# Patient Record
Sex: Female | Born: 1960 | Race: Black or African American | Hispanic: No | Marital: Single | Smoking: Current every day smoker
Health system: Southern US, Community
[De-identification: ages and names within clinical notes are randomized; demographics above are authoritative.]

## PROBLEM LIST (undated history)

## (undated) DIAGNOSIS — E611 Iron deficiency: Secondary | ICD-10-CM

## (undated) DIAGNOSIS — K219 Gastro-esophageal reflux disease without esophagitis: Secondary | ICD-10-CM

## (undated) HISTORY — PX: ABDOMINAL HYSTERECTOMY: SHX81

---

## 1998-01-05 ENCOUNTER — Ambulatory Visit (HOSPITAL_COMMUNITY): Admission: RE | Admit: 1998-01-05 | Discharge: 1998-01-05 | Payer: Self-pay | Admitting: Obstetrics

## 1998-01-07 ENCOUNTER — Ambulatory Visit (HOSPITAL_COMMUNITY): Admission: RE | Admit: 1998-01-07 | Discharge: 1998-01-07 | Payer: Self-pay | Admitting: Obstetrics

## 1998-01-14 ENCOUNTER — Ambulatory Visit (HOSPITAL_COMMUNITY): Admission: RE | Admit: 1998-01-14 | Discharge: 1998-01-14 | Payer: Self-pay | Admitting: Obstetrics

## 1998-01-21 ENCOUNTER — Ambulatory Visit (HOSPITAL_COMMUNITY): Admission: RE | Admit: 1998-01-21 | Discharge: 1998-01-21 | Payer: Self-pay | Admitting: Obstetrics

## 1998-01-23 ENCOUNTER — Ambulatory Visit (HOSPITAL_COMMUNITY): Admission: RE | Admit: 1998-01-23 | Discharge: 1998-01-23 | Payer: Self-pay | Admitting: Obstetrics

## 1998-02-10 ENCOUNTER — Inpatient Hospital Stay (HOSPITAL_COMMUNITY): Admission: RE | Admit: 1998-02-10 | Discharge: 1998-02-13 | Payer: Self-pay | Admitting: Obstetrics

## 1998-06-29 ENCOUNTER — Ambulatory Visit (HOSPITAL_COMMUNITY): Admission: RE | Admit: 1998-06-29 | Discharge: 1998-06-29 | Payer: Self-pay | Admitting: Obstetrics

## 1998-06-29 ENCOUNTER — Encounter: Payer: Self-pay | Admitting: Obstetrics

## 1999-03-20 ENCOUNTER — Emergency Department (HOSPITAL_COMMUNITY): Admission: EM | Admit: 1999-03-20 | Discharge: 1999-03-20 | Payer: Self-pay | Admitting: Emergency Medicine

## 1999-03-24 ENCOUNTER — Other Ambulatory Visit: Admission: RE | Admit: 1999-03-24 | Discharge: 1999-03-24 | Payer: Self-pay | Admitting: Obstetrics

## 1999-04-20 ENCOUNTER — Encounter: Payer: Self-pay | Admitting: Obstetrics

## 1999-04-20 ENCOUNTER — Ambulatory Visit (HOSPITAL_COMMUNITY): Admission: RE | Admit: 1999-04-20 | Discharge: 1999-04-20 | Payer: Self-pay | Admitting: Obstetrics

## 2001-04-03 ENCOUNTER — Ambulatory Visit (HOSPITAL_COMMUNITY): Admission: RE | Admit: 2001-04-03 | Discharge: 2001-04-03 | Payer: Self-pay | Admitting: Obstetrics

## 2001-04-03 ENCOUNTER — Encounter: Payer: Self-pay | Admitting: Obstetrics

## 2003-05-21 ENCOUNTER — Encounter: Payer: Self-pay | Admitting: Internal Medicine

## 2003-05-21 ENCOUNTER — Encounter: Admission: RE | Admit: 2003-05-21 | Discharge: 2003-05-21 | Payer: Self-pay | Admitting: Internal Medicine

## 2004-09-13 ENCOUNTER — Encounter: Admission: RE | Admit: 2004-09-13 | Discharge: 2004-09-13 | Payer: Self-pay | Admitting: Internal Medicine

## 2006-05-24 ENCOUNTER — Encounter: Admission: RE | Admit: 2006-05-24 | Discharge: 2006-05-24 | Payer: Self-pay | Admitting: Internal Medicine

## 2010-08-29 ENCOUNTER — Encounter: Payer: Self-pay | Admitting: Internal Medicine

## 2011-07-22 ENCOUNTER — Encounter (HOSPITAL_COMMUNITY)
Admission: RE | Admit: 2011-07-22 | Discharge: 2011-07-22 | Disposition: A | Discharge status: Home / Self Care | Payer: Managed Care, Other (non HMO) | Source: Ambulatory Visit | Attending: Obstetrics and Gynecology | Admitting: Obstetrics and Gynecology

## 2011-07-22 ENCOUNTER — Encounter (HOSPITAL_COMMUNITY): Payer: Self-pay

## 2011-07-22 HISTORY — DX: Gastro-esophageal reflux disease without esophagitis: K21.9

## 2011-07-22 LAB — CBC
Hemoglobin: 14 g/dL (ref 12.0–15.0)
RBC: 4.3 MIL/uL (ref 3.87–5.11)

## 2011-07-22 NOTE — Patient Instructions (Signed)
YOUR PROCEDURE IS SCHEDULED ON:07/25/11  ENTER THROUGH THE MAIN ENTRANCE OF [facility] AT: 6am  USE DESK PHONE AND DIAL [phone] TO INFORM us OF YOUR ARRIVAL  CALL [phone] IF YOU HAVE ANY QUESTIONS OR PROBLEMS PRIOR TO YOUR ARRIVAL.  REMEMBER: DO NOT EAT OR DRINK AFTER MIDNIGHT : Sunday  SPECIAL INSTRUCTIONS:   YOU MAY BRUSH YOUR TEETH THE MORNING OF SURGERY   TAKE THESE MEDICINES THE DAY OF SURGERY WITH SIP OF WATER: Pepcid   DO NOT WEAR JEWELRY, EYE MAKEUP, LIPSTICK OR DARK FINGERNAIL POLISH DO NOT WEAR LOTIONS  DO NOT SHAVE FOR 48 HOURS PRIOR TO SURGERY  YOU WILL NOT BE ALLOWED TO DRIVE YOURSELF HOME.  NAME OF DRIVER:Thelma

## 2011-07-25 ENCOUNTER — Ambulatory Visit (HOSPITAL_COMMUNITY): Payer: Managed Care, Other (non HMO) | Admitting: Registered Nurse

## 2011-07-25 ENCOUNTER — Encounter (HOSPITAL_COMMUNITY): Payer: Self-pay | Admitting: *Deleted

## 2011-07-25 ENCOUNTER — Encounter (HOSPITAL_COMMUNITY)
Admission: RE | Disposition: A | Discharge status: Home / Self Care | Payer: Self-pay | Source: Ambulatory Visit | Attending: Obstetrics and Gynecology

## 2011-07-25 ENCOUNTER — Encounter (HOSPITAL_COMMUNITY): Payer: Self-pay | Admitting: Registered Nurse

## 2011-07-25 ENCOUNTER — Other Ambulatory Visit (HOSPITAL_COMMUNITY): Payer: Self-pay | Admitting: Obstetrics and Gynecology

## 2011-07-25 ENCOUNTER — Inpatient Hospital Stay (HOSPITAL_COMMUNITY)
Admission: RE | Admit: 2011-07-25 | Discharge: 2011-07-26 | DRG: 748 | Disposition: A | Discharge status: Home / Self Care | Payer: Managed Care, Other (non HMO) | Source: Ambulatory Visit | Attending: Obstetrics and Gynecology | Admitting: Obstetrics and Gynecology

## 2011-07-25 DIAGNOSIS — N393 Stress incontinence (female) (male): Secondary | ICD-10-CM | POA: Diagnosis present

## 2011-07-25 DIAGNOSIS — N993 Prolapse of vaginal vault after hysterectomy: Principal | ICD-10-CM | POA: Diagnosis present

## 2011-07-25 DIAGNOSIS — IMO0002 Reserved for concepts with insufficient information to code with codable children: Secondary | ICD-10-CM

## 2011-07-25 HISTORY — PX: BURCH PROCEDURE: SHX1273

## 2011-07-25 HISTORY — PX: ANTERIOR AND POSTERIOR REPAIR: SHX5121

## 2011-07-25 LAB — SAMPLE TO BLOOD BANK

## 2011-07-25 SURGERY — ANTERIOR (CYSTOCELE) AND POSTERIOR REPAIR (RECTOCELE)
Anesthesia: General | Site: Vagina | Wound class: Clean Contaminated

## 2011-07-25 MED ORDER — FENTANYL CITRATE 0.05 MG/ML IJ SOLN
INTRAMUSCULAR | Status: DC | PRN
  Administered 2011-07-25 (×6): 50 ug via INTRAVENOUS
  Administered 2011-07-25: 100 ug via INTRAVENOUS
  Administered 2011-07-25: 50 ug via INTRAVENOUS

## 2011-07-25 MED ORDER — MENTHOL 3 MG MT LOZG
1.0000 | LOZENGE | OROMUCOSAL | Status: DC | PRN
  Filled 2011-07-25: qty 9

## 2011-07-25 MED ORDER — ONDANSETRON HCL 4 MG/2ML IJ SOLN
INTRAMUSCULAR | Status: DC | PRN
  Administered 2011-07-25: 4 mg via INTRAVENOUS

## 2011-07-25 MED ORDER — CEFAZOLIN SODIUM 1-5 GM-% IV SOLN
INTRAVENOUS | Status: AC
  Filled 2011-07-25: qty 50

## 2011-07-25 MED ORDER — LIDOCAINE HCL (CARDIAC) 20 MG/ML IV SOLN
INTRAVENOUS | Status: AC
  Filled 2011-07-25: qty 5

## 2011-07-25 MED ORDER — IBUPROFEN 600 MG PO TABS
600.0000 mg | ORAL_TABLET | Freq: Four times a day (QID) | ORAL | Status: DC | PRN
  Administered 2011-07-26: 600 mg via ORAL
  Filled 2011-07-25: qty 1

## 2011-07-25 MED ORDER — ONDANSETRON HCL 4 MG/2ML IJ SOLN
INTRAMUSCULAR | Status: AC
  Filled 2011-07-25: qty 2

## 2011-07-25 MED ORDER — KETOROLAC TROMETHAMINE 30 MG/ML IJ SOLN
INTRAMUSCULAR | Status: DC | PRN
  Administered 2011-07-25: 30 mg via INTRAVENOUS

## 2011-07-25 MED ORDER — MIDAZOLAM HCL 2 MG/2ML IJ SOLN
INTRAMUSCULAR | Status: AC
  Filled 2011-07-25: qty 2

## 2011-07-25 MED ORDER — MIDAZOLAM HCL 5 MG/5ML IJ SOLN
INTRAMUSCULAR | Status: DC | PRN
  Administered 2011-07-25: 2 mg via INTRAVENOUS

## 2011-07-25 MED ORDER — GLYCOPYRROLATE 0.2 MG/ML IJ SOLN
INTRAMUSCULAR | Status: DC | PRN
  Administered 2011-07-25: 0.2 mg via INTRAVENOUS

## 2011-07-25 MED ORDER — PROPOFOL 10 MG/ML IV EMUL
INTRAVENOUS | Status: DC | PRN
  Administered 2011-07-25: 200 mg via INTRAVENOUS

## 2011-07-25 MED ORDER — MORPHINE SULFATE 4 MG/ML IJ SOLN: 1.0000 mg | INTRAMUSCULAR | Status: DC | PRN

## 2011-07-25 MED ORDER — LIDOCAINE HCL (CARDIAC) 20 MG/ML IV SOLN
INTRAVENOUS | Status: DC | PRN
  Administered 2011-07-25: 100 mg via INTRAVENOUS

## 2011-07-25 MED ORDER — OXYCODONE-ACETAMINOPHEN 5-325 MG PO TABS
1.0000 | ORAL_TABLET | ORAL | Status: DC | PRN
Start: 2011-07-25 — End: 2011-07-26
  Administered 2011-07-25 – 2011-07-26 (×7): 1 via ORAL
  Filled 2011-07-25 (×7): qty 1

## 2011-07-25 MED ORDER — DEXTROSE IN LACTATED RINGERS 5 % IV SOLN
INTRAVENOUS | Status: DC
  Administered 2011-07-25 (×2): via INTRAVENOUS

## 2011-07-25 MED ORDER — MEPERIDINE HCL 25 MG/ML IJ SOLN: 6.2500 mg | INTRAMUSCULAR | Status: DC | PRN

## 2011-07-25 MED ORDER — BUPIVACAINE ON-Q PAIN PUMP (FOR ORDER SET NO CHG): INJECTION | Status: DC

## 2011-07-25 MED ORDER — KETOROLAC TROMETHAMINE 30 MG/ML IJ SOLN
INTRAMUSCULAR | Status: AC
  Filled 2011-07-25: qty 1

## 2011-07-25 MED ORDER — LACTATED RINGERS IV SOLN
INTRAVENOUS | Status: DC
  Administered 2011-07-25 (×3): via INTRAVENOUS

## 2011-07-25 MED ORDER — NEOSTIGMINE METHYLSULFATE 1 MG/ML IJ SOLN
INTRAMUSCULAR | Status: AC
  Filled 2011-07-25: qty 10

## 2011-07-25 MED ORDER — PROMETHAZINE HCL 25 MG/ML IJ SOLN: 6.2500 mg | INTRAMUSCULAR | Status: DC | PRN

## 2011-07-25 MED ORDER — FENTANYL CITRATE 0.05 MG/ML IJ SOLN
INTRAMUSCULAR | Status: AC
  Filled 2011-07-25: qty 5

## 2011-07-25 MED ORDER — HYDROMORPHONE HCL PF 1 MG/ML IJ SOLN: 0.2500 mg | INTRAMUSCULAR | Status: DC | PRN

## 2011-07-25 MED ORDER — KETOROLAC TROMETHAMINE 30 MG/ML IJ SOLN: 15.0000 mg | Freq: Once | INTRAMUSCULAR | Status: DC | PRN

## 2011-07-25 MED ORDER — PROPOFOL 10 MG/ML IV EMUL
INTRAVENOUS | Status: AC
  Filled 2011-07-25: qty 20

## 2011-07-25 MED ORDER — DEXTROSE IN LACTATED RINGERS 5 % IV SOLN: INTRAVENOUS | Status: DC

## 2011-07-25 MED ORDER — DEXAMETHASONE SODIUM PHOSPHATE 10 MG/ML IJ SOLN
INTRAMUSCULAR | Status: AC
  Filled 2011-07-25: qty 1

## 2011-07-25 MED ORDER — GLYCOPYRROLATE 0.2 MG/ML IJ SOLN
INTRAMUSCULAR | Status: AC
  Filled 2011-07-25: qty 1

## 2011-07-25 MED ORDER — NEOSTIGMINE METHYLSULFATE 1 MG/ML IJ SOLN
INTRAMUSCULAR | Status: DC | PRN
  Administered 2011-07-25: 1 mg via INTRAVENOUS

## 2011-07-25 MED ORDER — ROCURONIUM BROMIDE 100 MG/10ML IV SOLN
INTRAVENOUS | Status: DC | PRN
  Administered 2011-07-25: 40 mg via INTRAVENOUS

## 2011-07-25 MED ORDER — CEFAZOLIN SODIUM 1-5 GM-% IV SOLN
1.0000 g | INTRAVENOUS | Status: AC
  Administered 2011-07-25: 1 g via INTRAVENOUS

## 2011-07-25 MED ORDER — TEMAZEPAM 15 MG PO CAPS: 15.0000 mg | ORAL_CAPSULE | Freq: Every evening | ORAL | Status: DC | PRN

## 2011-07-25 MED ORDER — DEXAMETHASONE SODIUM PHOSPHATE 10 MG/ML IJ SOLN
INTRAMUSCULAR | Status: DC | PRN
  Administered 2011-07-25: 10 mg via INTRAVENOUS

## 2011-07-25 MED ORDER — ROCURONIUM BROMIDE 50 MG/5ML IV SOLN
INTRAVENOUS | Status: AC
  Filled 2011-07-25: qty 1

## 2011-07-25 SURGICAL SUPPLY — 44 items
BARRIER ADHS 3X4 INTERCEED (GAUZE/BANDAGES/DRESSINGS) IMPLANT
BRR ADH 4X3 ABS CNTRL BYND (GAUZE/BANDAGES/DRESSINGS)
CANISTER SUCTION 2500CC (MISCELLANEOUS) ×4 IMPLANT
CATH ROBINSON RED A/P 16FR (CATHETERS) IMPLANT
CLOTH BEACON ORANGE TIMEOUT ST (SAFETY) ×4 IMPLANT
DISSECTOR SPONGE CHERRY (GAUZE/BANDAGES/DRESSINGS) IMPLANT
DRAPE PROXIMA HALF (DRAPES) ×2 IMPLANT
DRAPE UNDERBUTTOCKS STRL (DRAPE) ×2 IMPLANT
DRSG COVADERM 4X10 (GAUZE/BANDAGES/DRESSINGS) ×2 IMPLANT
GAUZE PACKING IODOFORM 2 (PACKING) IMPLANT
GAUZE SPONGE 4X4 16PLY XRAY LF (GAUZE/BANDAGES/DRESSINGS) ×8 IMPLANT
GLOVE BIO SURGEON STRL SZ 6 (GLOVE) ×4 IMPLANT
GLOVE BIO SURGEON STRL SZ7 (GLOVE) ×8 IMPLANT
GLOVE BIOGEL PI IND STRL 7.0 (GLOVE) ×5 IMPLANT
GLOVE BIOGEL PI INDICATOR 7.0 (GLOVE) ×1
GOWN PREVENTION PLUS LG XLONG (DISPOSABLE) ×8 IMPLANT
GOWN PREVENTION PLUS XLARGE (GOWN DISPOSABLE) ×12 IMPLANT
LEGGING LITHOTOMY PAIR STRL (DRAPES) ×2 IMPLANT
NS IRRIG 1000ML POUR BTL (IV SOLUTION) ×4 IMPLANT
PACK ABDOMINAL GYN (CUSTOM PROCEDURE TRAY) ×2 IMPLANT
PACK VAGINAL WOMENS (CUSTOM PROCEDURE TRAY) ×4 IMPLANT
PAD OB MATERNITY 4.3X12.25 (PERSONAL CARE ITEMS) ×4 IMPLANT
SHEET LAVH (DRAPES) ×2 IMPLANT
SPONGE LAP 18X18 X RAY DECT (DISPOSABLE) ×8 IMPLANT
STAPLER VISISTAT 35W (STAPLE) ×4 IMPLANT
SUT CHROMIC 0 UR 5 27 (SUTURE) ×2 IMPLANT
SUT CHROMIC 2 0 CT 1 (SUTURE) ×4 IMPLANT
SUT CHROMIC 2 0 UR 5 27 (SUTURE) ×2 IMPLANT
SUT PLAIN 2 0 (SUTURE) ×4
SUT PLAIN 2 0 XLH (SUTURE) IMPLANT
SUT PLAIN ABS 2-0 CT1 27XMFL (SUTURE) ×1 IMPLANT
SUT VIC AB 0 CT1 36 (SUTURE) ×22 IMPLANT
SUT VIC AB 2-0 CT1 (SUTURE) ×4 IMPLANT
SUT VIC AB 2-0 CT1 27 (SUTURE) ×4
SUT VIC AB 2-0 CT1 TAPERPNT 27 (SUTURE) ×1 IMPLANT
SUT VIC AB 3-0 PS2 18 (SUTURE) ×4
SUT VIC AB 3-0 PS2 18XBRD (SUTURE) ×1 IMPLANT
SUT VIC AB 4-0 KS 27 (SUTURE) IMPLANT
SUT VICRYL 0 TIES 12 18 (SUTURE) ×4 IMPLANT
SUT VICRYL 0 UR6 27IN ABS (SUTURE) ×4 IMPLANT
SUT VICRYL 4-0 PS2 18IN ABS (SUTURE) ×2 IMPLANT
TOWEL OR 17X24 6PK STRL BLUE (TOWEL DISPOSABLE) ×8 IMPLANT
TRAY FOLEY CATH 14FR (SET/KITS/TRAYS/PACK) ×4 IMPLANT
WATER STERILE IRR 1000ML POUR (IV SOLUTION) ×4 IMPLANT

## 2011-07-25 NOTE — Anesthesia Preprocedure Evaluation (Addendum)
Anesthesia Evaluation  Patient identified by MRN, date of birth, ID band Patient awake    Reviewed: Allergy & Precautions, H&P , NPO status , Patient's Chart, lab work & pertinent test results  Airway Mallampati: II TM Distance: >3 FB Neck ROM: full    Dental No notable dental hx. (+) Teeth Intact   Pulmonary neg pulmonary ROS, Current Smoker,    Pulmonary exam normal       Cardiovascular     Neuro/Psych Negative Neurological ROS  Negative Psych ROS   GI/Hepatic Neg liver ROS, GERD-  Medicated and Controlled,  Endo/Other  Morbid obesity  Renal/GU negative Renal ROS  Genitourinary negative   Musculoskeletal negative musculoskeletal ROS (+)   Abdominal (+) obese,   Peds negative pediatric ROS (+)  Hematology negative hematology ROS (+)   Anesthesia Other Findings   Reproductive/Obstetrics negative OB ROS                          Anesthesia Physical Anesthesia Plan  ASA: III  Anesthesia Plan: General   Post-op Pain Management:    Induction: Intravenous  Airway Management Planned: Oral ETT  Additional Equipment:   Intra-op Plan:   Post-operative Plan: Extubation in OR  Informed Consent: I have reviewed the patients History and Physical, chart, labs and discussed the procedure including the risks, benefits and alternatives for the proposed anesthesia with the patient or authorized representative who has indicated his/her understanding and acceptance.   Dental Advisory Given  Plan Discussed with: CRNA  Anesthesia Plan Comments:         Anesthesia Quick Evaluation

## 2011-07-25 NOTE — H&P (Signed)
Sue Holloway is an 50 y.o. female.[id number] s/p hysterectomy complaining of worsening USI after complaining of "bladder dropping. Over the past few months symptoms of urinary loss have become severe, requiring her to wear large pads and carry a change of clothes for accidents.  Urodynamic evaluation indicates pure stress incontinence.  Kegel exercises over the past month helped some but not enough.  The patient is aware of the risks, possible complications and failure rate of the planned procedure.  Alternative methods for urologic repair including sling procedure has been offered and discussed.  The patient requests that I perform the Burch procedure and cystocele repair.   Pertinent Gynecological History: Menses: no Bleeding: no Contraception: status post hysterectomy DES exposure: unknown Blood transfusions: none Sexually transmitted diseases: no past history Previous GYN Procedures: hysterectomy  Last mammogram: normal Date: July 2012 Last pap: s/p hyst Date: na OB History: G6, P3   Menstrual History: Menarche age: na No LMP recorded. Patient has had a hysterectomy.    Past Medical History  Diagnosis Date  . Gout   . GERD (gastroesophageal reflux disease)     0ccasional heartburn    Past Surgical History  Procedure Date  . Abdominal hysterectomy     Family History:  Hypertension- siblings, father     Heart disease- brother, sister  Social History:  reports that she has been smoking Cigarettes.  She has been smoking about .5 packs per day. She does not have any smokeless tobacco history on file. She reports that she drinks alcohol. She reports that she does not use illicit drugs.  Allergies: No Known Allergies   (Not in a hospital admission)  Review of Systems  Constitutional: Negative.   HENT: Negative.   Eyes: Negative.   Respiratory: Negative.   Cardiovascular: Negative.   Gastrointestinal: Negative.   Genitourinary: Negative.   Musculoskeletal: Negative.    Skin: Negative.   Neurological: Negative.   Endo/Heme/Allergies: Negative.   Psychiatric/Behavioral: Negative.     There were no vitals taken for this visit. Physical Exam  Constitutional: She is oriented to person, place, and time. She appears well-developed and well-nourished.  HENT:  Head: Normocephalic.  Mouth/Throat: Oropharynx is clear and moist. No oropharyngeal exudate.  Eyes: Right eye exhibits no discharge. Left eye exhibits no discharge. No scleral icterus.  Neck: Normal range of motion. Neck supple. No JVD present. No tracheal deviation present. No thyromegaly present.  Cardiovascular: Normal rate, regular rhythm, normal heart sounds and intact distal pulses.  Exam reveals no gallop and no friction rub.   No murmur heard. Respiratory: Effort normal and breath sounds normal. No stridor.  GI: Soft. Bowel sounds are normal. She exhibits no mass. There is no rebound and no guarding.  Genitourinary: Vagina normal. No vaginal discharge found.  Musculoskeletal: She exhibits no edema and no tenderness.  Lymphadenopathy:    She has no cervical adenopathy.  Neurological: She is alert and oriented to person, place, and time. She has normal reflexes. She exhibits normal muscle tone. Coordination normal.  Skin: Skin is dry. No rash noted. No erythema. No pallor.  Psychiatric: She has a normal mood and affect. Her behavior is normal. Judgment and thought content normal.    No results found for this or any previous visit (from the past 24 hour(s)).  No results found.  Assessment/Plan: 50 year old female s/p hysterectomy with urinary stress incontinence P:  Burch urethropexy and cystourethrocele repair   Sue Holloway E 07/25/2011, 5:50 AM

## 2011-07-25 NOTE — Transfer of Care (Signed)
Immediate Anesthesia Transfer of Care Note  Patient: Sue Holloway  Procedure(s) Performed:  ANTERIOR (CYSTOCELE) AND POSTERIOR REPAIR (RECTOCELE) - Anterior Repair; BURCH PROCEDURE  Patient Location: PACU  Anesthesia Type: General  Level of Consciousness: awake, alert , oriented and patient cooperative  Airway & Oxygen Therapy: Patient Spontanous Breathing and Patient connected to nasal cannula oxygen  Post-op Assessment: Report given to PACU RN  Post vital signs: Reviewed  Complications: No apparent anesthesia complications

## 2011-07-25 NOTE — Op Note (Signed)
07/25/2011   1:28 PM INDICATIONS AND CONSENT:  PATIENT:  Sue Holloway  50 y.o. female gravida 6 para 3, A3 with worsening urinary stress incontinence.  Kegel exercises have been done with minimal relief. The patient loses large volumes of urine upon coughing, sneezing and exercising. She must wear  Pads and care an extra change in clothing. 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   Thus the patient is scheduled for retropubic urethropexy or Burch procedure and cystourethrocele repair. Risks possible complications, alternative methods including sling procedure have been discussed with the patient and informed consent was given for Burch urethropexy and cystourethrocele repair.  She understands that there is a failure rate of over 20% after 5 years and symptoms may recur with time.  PRE-OPERATIVE DIAGNOSIS:  stress incontinence, cystocele  POST-OPERATIVE DIAGNOSIS:  stress incontinence;cystocele  PROCEDURE:  Procedure(s): ANTERIOR (CYSTOCELE) AND BURCH PROCEDURE  Detail of procedures:  The patient was placed on the table in a supine position. General anesthesia was induced. The abdomen and vagina were sterilely prepped and draped in the usual fashion after the patient had been placed in a dorsal lithotomy position. A Foley catheter was inserted with a 30 cc balloon.  Then we went above.  The patient"s Legs were rotated downward and a low midline incision was made within the previous hysterectomy incision. The subcutaneous layer was opened and there was some scarring. And a small opening of the peritoneum was inadvertently made. This was closed with 2-0 Vicryl in a running fashion. Using blunt and sharp dissection we dissected out the space of Retzius by pressing down on the perivesicular fat and fascia.Wewere able to identify the Cooper's ligament and  bites of 0 Vicryl suture were placed in the Cooper's ligament medially and approximately 3 cm laterally on both sides.  These were held.  This  dissection took a little extra time because of scarring as well as vasculature of the space, careful not to interrupt larges vessels. Next a gloved hand was placed into the vagina and the UV angle was identified and an  Allis clamp was placed on both sides of the UV angle.  Then another Allis clamp was placed superiorly and laterally to the UV angle.  This was done on both sides.  The catheter was tugged on to identify the UV angle.  The glove was then discarded.  At this point sutures were placed in the perivesicular fascia, being careful not to go too deep. On both sides  fascial bites were taken, a medial and lateral one.  The medial  was placed approximately 1-2 cm away from the UV angle and the lateral one other being 3 cm laterally and superiorly.   After the 4 sutures had been placed these were carefully tied down to elevate the UV angle and the sutures were then trimmed. Hemostasis was excellent.  At this point the peritoneum was then rechecked and I  confirmed that the peritoneum was closed.  The fascia was then identified and closed with 0 Vicryl in an interrupted fashion. The subcutaneous layer was closed with 2-0 plain in a running fashion. And the skin was closed in a subcuticular fashion using 4-0 Vicryl. We went below and then raised the patient's thighs examine back in the dorsal lithotomy position. I examined the patient and most of the redundant cystocele had been reduced but not all.  Therefore the anterior repair was initiated. This procedure was started by placing 2 Allis clamps horizontally approximately 4 cm apart.  Then an incision was made into the vaginal mucosa. A third Allis clamp was placed in the midline approximately to 3 cm above the horizontal Allises. Then using the Metzenbaum scissors a midline incision was extended up toward the urethra. Tee clamps were placed on the vaginal edges and using sharp and blunt dissection the cystourethrocele was mobilized.  We continued to work our  way up to within 1-1/2 cm of the urethral opening. Vaginal  epithelium was  separated from the perivesicular fascia.  The endopelvic fascia was sutured in the midline to close the defect after the cystocele was mobilized.. At this point the vaginal mucosal edges were trimmed down and sent to pathology. Tee clamps were placed on the edges of the vaginal mucosa.  Interrupted sutures were placed and tied in the midline to reduce the urethrocele, elevating the UV angle. To reduce the cystocele a pursestring suture was placed in the endopelvic fascia and then tied down to obliterate the cystocele and then the fascia was tied in the midline until the cystocele was completely obliterated.  The vaginal mucosa  was then closed with 2-0 Vicryl in an interrupted fashion. The procedure was then terminated and patient was then taken to recovery in good condition.      SURGEON:  Surgeon(s): Fortino Sic, MD Pricilla Holm, MD   ANESTHESIA:   general  EBL:  Total I/O In: 2500 [I.V.:2500] Out: 370 [Urine:195; Blood:175]  BLOOD ADMINISTERED:none  DRAINS: Urinary Catheter (Foley)   LOCAL MEDICATIONS USED:  NONE  SPECIMEN:  Source of Specimen:  vaginal skin, anterior wall  DISPOSITION OF SPECIMEN:  PATHOLOGY  COUNTS:  YES  TOURNIQUET:  * No tourniquets in log *  DICTATION: .Dragon Dictation  PLAN OF CARE: Admit to inpatient   PATIENT DISPOSITION:  PACU - hemodynamically stable.   Delay start of Pharmacological VTE agent (>24hrs) due to surgical blood loss or risk of bleeding:  {YES/NO/NOT APPLICABLE:20182

## 2011-07-25 NOTE — Anesthesia Postprocedure Evaluation (Signed)
Anesthesia Post Note  Patient: Sue Holloway  Procedure(s) Performed:  ANTERIOR (CYSTOCELE) AND POSTERIOR REPAIR (RECTOCELE) - Anterior Repair; BURCH PROCEDURE  Anesthesia type: General  Patient location: PACU  Post pain: Pain level controlled  Post assessment: Post-op Vital signs reviewed  Last Vitals:  Filed Vitals:   07/25/11 1130  BP: 147/94  Pulse: 64  Temp: 36.9 C  Resp: 16    Post vital signs: Reviewed  Level of consciousness: sedated  Complications: No apparent anesthesia complications

## 2011-07-25 NOTE — Preoperative (Signed)
Beta Blockers   Reason not to administer Beta Blockers:Not Applicable 

## 2011-07-25 NOTE — Anesthesia Postprocedure Evaluation (Signed)
  Anesthesia Post-op Note  Patient: Sue Holloway  Procedure(s) Performed:  ANTERIOR (CYSTOCELE) AND POSTERIOR REPAIR (RECTOCELE) - Anterior Repair; BURCH PROCEDURE  Patient Location: [facility]  Anesthesia Type: General  Level of Consciousness: awake, alert  and oriented  Airway and Oxygen Therapy: Patient Spontanous Breathing  Post-op Pain: none  Post-op Assessment: Post-op Vital signs reviewed  Post-op Vital Signs: Reviewed stable  Complications: No apparent anesthesia complications

## 2011-07-25 NOTE — Addendum Note (Signed)
Addendum  created 07/25/11 1640 by Madison Hickman   Modules edited:Notes Section

## 2011-07-26 ENCOUNTER — Encounter (HOSPITAL_COMMUNITY): Payer: Self-pay | Admitting: Obstetrics and Gynecology

## 2011-07-26 LAB — CBC
Hemoglobin: 11 g/dL — ABNORMAL LOW (ref 12.0–15.0)
RBC: 3.45 MIL/uL — ABNORMAL LOW (ref 3.87–5.11)
WBC: 10.6 10*3/uL — ABNORMAL HIGH (ref 4.0–10.5)

## 2011-07-26 LAB — DIFFERENTIAL
Basophils Absolute: 0 10*3/uL (ref 0.0–0.1)
Basophils Relative: 0 % (ref 0–1)
Lymphocytes Relative: 29 % (ref 12–46)
Neutro Abs: 6.5 10*3/uL (ref 1.7–7.7)
Neutrophils Relative %: 61 % (ref 43–77)

## 2011-07-26 MED ORDER — SODIUM CHLORIDE 0.9 % IJ SOLN
3.0000 mL | Freq: Two times a day (BID) | INTRAMUSCULAR | Status: DC
  Administered 2011-07-26: 3 mL via INTRAVENOUS

## 2011-07-26 NOTE — Progress Notes (Signed)
UR Chart review completed.  

## 2011-07-26 NOTE — Progress Notes (Signed)
Patient given discharge instructions, prescriptions, and questions answered. Steri-strips applied to incision. Patient ambulatory.

## 2011-07-27 NOTE — Progress Notes (Signed)
D/c summary [id number].  Note from rounds on 12/18 when patient was discharged not typed; rather, it is dictated as part of the discharge summary.

## 2011-07-28 NOTE — Discharge Summary (Signed)
NAME:  Sue Holloway, Sue Holloway NO.:  [id number]  MEDICAL RECORD NO.:  [id number]  LOCATION:  9317                          FACILITY:  [facility]  PHYSICIAN:  Pricilla Holm, MD      DATE OF BIRTH:  10/18/1960  DATE OF ADMISSION:  07/25/2011 DATE OF DISCHARGE:  07/26/2011                              DISCHARGE SUMMARY   PREADMISSION DIAGNOSES: Stress urinary incontinence and cystocele.  DISCHARGE DIAGNOSES:  Stress urinary incontinence and cystocele, status post anterior repair and Burch procedure.  HISTORY OF PRESENT ILLNESS:  Please see dictated or typed H and P for further details regarding admission.  HOSPITAL COURSE:  The patient was admitted for surgical treatment of pelvic prolapse and stress urinary incontinence.  Please see dictated report of her Burch procedure and anterior repair which was performed on July 25, 2011.  After surgery, the patient was admitted for observation.  By postoperative day #1, the patient had tolerated regular diet, ambulated, urinated without difficulty status post removal of the Foley, passed flatus, and her pain was well controlled.  Given this, the patient was deemed stable for discharge.  The patient was given strict postoperative precautions.  The patient was also given prescriptions for Percocet and Motrin.  The patient is to follow up in 1 week for an incision check with Dr. Neva Seat.  The patient, prior to her discharge, had Steri-Strips placed over the incision.  Dr. Neva Seat also saw the patient on postoperative day #1, as did I, where the above physical findings were confirmed.  The patient's physical exam on postoperative day 1 was grossly normal.  The incision appeared to be well approximated with no evidence of infection.  There were no Steri- Strips at that time and, therefore, a verbal order was given to the nurse to place Steri-Strips over the incision.  Otherwise, the patient had no evidence of deep vein thrombosis at  the time of discharge.  Her lungs were clear auscultation bilaterally and heart demonstrated regular rate and rhythm.  The patient's abdomen was appropriately tender given her postoperative stay.  The patient did not report a significant amount of vaginal bleeding.  Given the patient's success in achieving all routine postoperative milestones, the patient was deemed stable for discharge.          ______________________________ Pricilla Holm, MD    RB/MEDQ  D:  07/27/2011  T:  07/28/2011  Job:  [id number]

## 2012-05-18 ENCOUNTER — Emergency Department (HOSPITAL_COMMUNITY)
Admission: EM | Admit: 2012-05-18 | Discharge: 2012-05-18 | Disposition: A | Discharge status: Home / Self Care | Payer: Self-pay | Attending: Emergency Medicine | Admitting: Emergency Medicine

## 2012-05-18 ENCOUNTER — Encounter (HOSPITAL_COMMUNITY): Payer: Self-pay | Admitting: *Deleted

## 2012-05-18 ENCOUNTER — Emergency Department (HOSPITAL_COMMUNITY): Payer: Self-pay

## 2012-05-18 DIAGNOSIS — R059 Cough, unspecified: Secondary | ICD-10-CM | POA: Insufficient documentation

## 2012-05-18 DIAGNOSIS — M109 Gout, unspecified: Secondary | ICD-10-CM | POA: Insufficient documentation

## 2012-05-18 DIAGNOSIS — J40 Bronchitis, not specified as acute or chronic: Secondary | ICD-10-CM

## 2012-05-18 DIAGNOSIS — F17200 Nicotine dependence, unspecified, uncomplicated: Secondary | ICD-10-CM | POA: Insufficient documentation

## 2012-05-18 DIAGNOSIS — R05 Cough: Secondary | ICD-10-CM | POA: Insufficient documentation

## 2012-05-18 DIAGNOSIS — K219 Gastro-esophageal reflux disease without esophagitis: Secondary | ICD-10-CM | POA: Insufficient documentation

## 2012-05-18 LAB — URINALYSIS, ROUTINE W REFLEX MICROSCOPIC
Glucose, UA: NEGATIVE mg/dL
Hgb urine dipstick: NEGATIVE
Ketones, ur: NEGATIVE mg/dL
Protein, ur: NEGATIVE mg/dL

## 2012-05-18 LAB — URINE MICROSCOPIC-ADD ON

## 2012-05-18 LAB — CBC WITH DIFFERENTIAL/PLATELET
Basophils Absolute: 0 10*3/uL (ref 0.0–0.1)
Basophils Relative: 1 % (ref 0–1)
Hemoglobin: 14.4 g/dL (ref 12.0–15.0)
MCHC: 34 g/dL (ref 30.0–36.0)
Neutro Abs: 2.4 10*3/uL (ref 1.7–7.7)
Neutrophils Relative %: 40 % — ABNORMAL LOW (ref 43–77)
RDW: 12.9 % (ref 11.5–15.5)

## 2012-05-18 LAB — COMPREHENSIVE METABOLIC PANEL
AST: 13 U/L (ref 0–37)
Albumin: 3.6 g/dL (ref 3.5–5.2)
Alkaline Phosphatase: 99 U/L (ref 39–117)
Chloride: 102 mEq/L (ref 96–112)
Potassium: 4.1 mEq/L (ref 3.5–5.1)
Total Bilirubin: 0.3 mg/dL (ref 0.3–1.2)

## 2012-05-18 MED ORDER — ALBUTEROL SULFATE HFA 108 (90 BASE) MCG/ACT IN AERS
2.0000 | INHALATION_SPRAY | Freq: Once | RESPIRATORY_TRACT | Status: AC
  Administered 2012-05-18: 2 via RESPIRATORY_TRACT
  Filled 2012-05-18: qty 6.7

## 2012-05-18 MED ORDER — AZITHROMYCIN 250 MG PO TABS: 250.0000 mg | ORAL_TABLET | Freq: Every day | ORAL | Status: DC

## 2012-05-18 MED ORDER — AEROCHAMBER PLUS W/MASK MISC
Status: AC
  Administered 2012-05-18: 07:00:00
  Filled 2012-05-18: qty 1

## 2012-05-18 MED ORDER — ALBUTEROL SULFATE (5 MG/ML) 0.5% IN NEBU
5.0000 mg | INHALATION_SOLUTION | Freq: Once | RESPIRATORY_TRACT | Status: AC
  Administered 2012-05-18: 5 mg via RESPIRATORY_TRACT
  Filled 2012-05-18: qty 1

## 2012-05-18 NOTE — ED Notes (Signed)
Returned to room.

## 2012-05-18 NOTE — ED Notes (Addendum)
Stated  Bad cough for the last months now, was taking  OTC mucinex for 7days and not getting better,  Was woke up this morning with bad cough and wheezing. Denies any hx of asthma or other resp problem. HX smoking.

## 2012-05-18 NOTE — ED Provider Notes (Signed)
History     CSN: [id number]  Arrival date & time 05/18/12  0454   First MD Initiated Contact with Patient 05/18/12 0518      Chief Complaint  Patient presents with  . Cough    (Consider location/radiation/quality/duration/timing/severity/associated sxs/prior treatment) HPI Comments: Patient presents complaining of a one week history of chest congestion, cough.  She woke early this am with congestion in her throat and could not catch her breath.  She felt like she was gasping for air. She coughed up a good amount of phlegm and is now feeling somewhat better.    She is a smoker of 1/2 PPD for many years.  She denies any ill contacts, but does report that she has been working to clean some of her sister's belongings who recently passed away.  She is unsure if exposure to dust at her sister's house may be the cause.    Patient is a 51 y.o. female presenting with cough. The history is provided by the patient.  Cough This is a new problem. Episode onset: one week ago. The problem occurs constantly. The problem has been rapidly worsening. The cough is productive of sputum. The maximum temperature recorded prior to her arrival was 100 to 100.9 F. The fever has been present for 1 to 2 days. Associated symptoms include shortness of breath. She is a smoker.    Past Medical History  Diagnosis Date  . Gout   . GERD (gastroesophageal reflux disease)     0ccasional heartburn    Past Surgical History  Procedure Date  . Abdominal hysterectomy   . Anterior and posterior repair 07/25/2011    Procedure: ANTERIOR (CYSTOCELE) AND POSTERIOR REPAIR (RECTOCELE);  Surgeon: Fortino Sic, MD;  Location: [facility];  Service: Gynecology;  Laterality: N/A;  Anterior Repair  . Burch procedure 07/25/2011    Procedure: BURCH PROCEDURE;  Surgeon: Fortino Sic, MD;  Location: [facility];  Service: Gynecology;  Laterality: N/A;    History reviewed. No pertinent family history.  History  Substance Use Topics    . Smoking status: Current Every Day Smoker -- 0.5 packs/day    Types: Cigarettes  . Smokeless tobacco: Not on file  . Alcohol Use: Yes     occasional    OB History    Grav Para Term Preterm Abortions TAB SAB Ect Mult Living                  Review of Systems  Respiratory: Positive for cough and shortness of breath.   All other systems reviewed and are negative.    Allergies  Review of patient's allergies indicates no known allergies.  Home Medications   Current Outpatient Rx  Name Route Sig Dispense Refill  . DM-GUAIFENESIN ER 30-600 MG PO TB12 Oral Take 1 tablet by mouth every 12 (twelve) hours.      BP 150/102  Pulse 94  Temp 98.4 F (36.9 C) (Oral)  Resp 18  SpO2 96%  Physical Exam  Nursing note and vitals reviewed. Constitutional: She is oriented to person, place, and time. She appears well-developed and well-nourished. No distress.  HENT:  Head: Normocephalic and atraumatic.  Neck: Normal range of motion. Neck supple.  Cardiovascular: Normal rate and regular rhythm.  Exam reveals no gallop and no friction rub.   No murmur heard. Pulmonary/Chest: Effort normal and breath sounds normal. No respiratory distress.       There are scattered rhonchi present bilaterally.  Abdominal: Soft. Bowel sounds are normal.  She exhibits no distension. There is no tenderness.  Musculoskeletal: Normal range of motion.  Neurological: She is alert and oriented to person, place, and time.  Skin: Skin is warm and dry. She is not diaphoretic.    ED Course  Procedures (including critical care time)   Labs Reviewed  CBC WITH DIFFERENTIAL  COMPREHENSIVE METABOLIC PANEL   No results found.   No diagnosis found.    MDM  The wbc is reassuring and the xray shows what appears to be an atypical pulmonary infection.  She will be treated with antibiotics, inhaler, and will be discharged to home.  To return prn if she worsens.          Geoffery Lyons, MD 05/18/12 [phone]

## 2012-05-18 NOTE — ED Notes (Signed)
Patient transported to X-ray 

## 2012-05-18 NOTE — ED Notes (Signed)
Pt states that she has been having a productive cough for the past month. Pt states that she has been taking OTC mucinex as well for the past month. Pt states that she woke up with wheezing and SOB. Pt not in respiratory distress at this time.

## 2012-08-24 ENCOUNTER — Emergency Department (INDEPENDENT_AMBULATORY_CARE_PROVIDER_SITE_OTHER)
Admission: EM | Admit: 2012-08-24 | Discharge: 2012-08-24 | Disposition: A | Discharge status: Home / Self Care | Payer: Self-pay | Source: Home / Self Care

## 2012-08-24 ENCOUNTER — Ambulatory Visit (HOSPITAL_COMMUNITY)
Admission: RE | Admit: 2012-08-24 | Discharge: 2012-08-24 | Disposition: A | Discharge status: Home / Self Care | Payer: Self-pay | Source: Ambulatory Visit | Attending: Family Medicine | Admitting: Family Medicine

## 2012-08-24 ENCOUNTER — Encounter (HOSPITAL_COMMUNITY): Payer: Self-pay | Admitting: *Deleted

## 2012-08-24 DIAGNOSIS — M7989 Other specified soft tissue disorders: Secondary | ICD-10-CM | POA: Insufficient documentation

## 2012-08-24 DIAGNOSIS — M79609 Pain in unspecified limb: Secondary | ICD-10-CM

## 2012-08-24 HISTORY — DX: Iron deficiency: E61.1

## 2012-08-24 NOTE — ED Notes (Signed)
Dr. Clementeen Graham has ordered a vascular study for the pt.  He said to send pt. by shuttle to the admitting office.  He will send pt. to ED if it is positive.

## 2012-08-24 NOTE — ED Provider Notes (Signed)
Medical screening examination/treatment/procedure(s) were performed by a resident physician and as supervising physician I was immediately available for consultation/collaboration.  Leslee Home, M.D.   Reuben Likes, MD 08/24/12 2012

## 2012-08-24 NOTE — Progress Notes (Signed)
VASCULAR LAB PRELIMINARY  PRELIMINARY  PRELIMINARY  PRELIMINARY  Left lower extremity venous duplex completed.    Preliminary report:  No evidence of left lower exrtemity DVT. 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 No evidence of a baker's cyst.  Acie Custis, RVS 08/24/2012, 6:46 PM

## 2012-08-24 NOTE — ED Provider Notes (Signed)
Sue Holloway is a 52 y.o. female who presents to Urgent Care today for left leg swelling starting Wednesday evening without injury. Patient notes swelling and pain in the anterior lateral aspect of her lower calf.  She denies any radiating pain weakness or numbness. She notes the pain is worse with ambulation.  She denies any fevers or chills or erythema. She notes the pain is moderate. She's tried a compression stocking but it was too painful.  Family history significant for multiple DVTs and PE in sister   PMH reviewed. Hypertension and gout  History  Substance Use Topics  . Smoking status: Current Every Day Smoker -- 0.2 packs/day    Types: Cigarettes  . Smokeless tobacco: Not on file  . Alcohol Use: Yes     Comment: occasional   ROS as above Medications reviewed. No current facility-administered medications for this encounter.   Current Outpatient Prescriptions  Medication Sig Dispense Refill  . azithromycin (ZITHROMAX) 250 MG tablet Take 1 tablet (250 mg total) by mouth daily. Take first 2 tablets together, then 1 every day until finished.  6 tablet  0  . dextromethorphan-guaiFENesin (MUCINEX DM) 30-600 MG per 12 hr tablet Take 1 tablet by mouth every 12 (twelve) hours.      . febuxostat (ULORIC) 40 MG tablet Take 40 mg by mouth daily.        Exam:  BP 153/95  Pulse 74  Temp 97.9 F (36.6 C) (Oral)  Resp 24  SpO2 99% Gen: Well NAD HEENT: EOMI,  MMM Lungs: CTABL Nl WOB Heart: RRR no MRG Abd: NABS, NT, ND Exts:  right leg with superficial varicose veins measuring 42 cm calf circumference at 10 cm distal to tibial tuberosity. Left leg with superficial varicose veins measuring 45 cm in circumference at 10 cm distal to tibial tuberosity. There is a tender palpable cord on the anterior lateral aspect of the calf.   Positive pain with ankle dorsiflexion. The calf is tender to palpation .   No results found for this or any previous visit (from the past 24 hour(s)). No  results found.  Assessment and Plan: 52 y.o. female with leg pain and swelling. Concerns for DVT versus superficial thrombophlebitis.  Plan: Doubtful for infection. Patient does not have signs or symptoms suggestive of PE.  I feel is reasonable for vascular ultrasound tonight. I have arranged for a vascular ultrasound to be performed tonight. I will call patient with results. If patient has a DVT or superficial thrombophlebitis extending to the saphenofemoral/saphenopopliteal junction we'll arrange for anticoagulation.  As she does not have health insurance I feel the only practical way of arranging for this will be to send the patient to the emergency room for further evaluation and managed.   I discussed this in length with the patient. She expresses understanding and agreement.  Discussed warning signs or symptoms. Please see discharge instructions.      Rodolph Bong, MD 08/24/12 1725  Addendum:  Receive report from ultrasonographer. Patient has superficial thrombosed vein is not approaching or extending to the deep veins. This is consistent with superficial thrombophlebitis.  Discussed treatment plan with patient.  NSAIDs and compression stocking. Followup with primary care provider. Followup here if worsening.  Rodolph Bong, MD 08/24/12 [phone]

## 2012-08-24 NOTE — ED Notes (Signed)
C/o left leg swelling onset Wed. Night.  No known injury.  Hx. varicose veins.  Sore and warm to touch on lat. lower leg.

## 2013-01-09 ENCOUNTER — Emergency Department (HOSPITAL_COMMUNITY): Payer: Self-pay

## 2013-01-09 ENCOUNTER — Emergency Department (HOSPITAL_COMMUNITY)
Admission: EM | Admit: 2013-01-09 | Discharge: 2013-01-10 | Disposition: A | Discharge status: Home / Self Care | Payer: Self-pay | Attending: Emergency Medicine | Admitting: Emergency Medicine

## 2013-01-09 ENCOUNTER — Encounter (HOSPITAL_COMMUNITY): Payer: Self-pay | Admitting: Emergency Medicine

## 2013-01-09 ENCOUNTER — Encounter (HOSPITAL_COMMUNITY): Payer: Self-pay | Admitting: *Deleted

## 2013-01-09 ENCOUNTER — Emergency Department (INDEPENDENT_AMBULATORY_CARE_PROVIDER_SITE_OTHER)
Admission: EM | Admit: 2013-01-09 | Discharge: 2013-01-09 | Disposition: A | Discharge status: Nursing Home (Includes ICF) | Payer: Self-pay | Source: Home / Self Care | Attending: Emergency Medicine | Admitting: Emergency Medicine

## 2013-01-09 DIAGNOSIS — R509 Fever, unspecified: Secondary | ICD-10-CM | POA: Insufficient documentation

## 2013-01-09 DIAGNOSIS — M7989 Other specified soft tissue disorders: Secondary | ICD-10-CM

## 2013-01-09 DIAGNOSIS — F17200 Nicotine dependence, unspecified, uncomplicated: Secondary | ICD-10-CM | POA: Insufficient documentation

## 2013-01-09 DIAGNOSIS — Z8639 Personal history of other endocrine, nutritional and metabolic disease: Secondary | ICD-10-CM | POA: Insufficient documentation

## 2013-01-09 DIAGNOSIS — R220 Localized swelling, mass and lump, head: Secondary | ICD-10-CM | POA: Insufficient documentation

## 2013-01-09 DIAGNOSIS — Z7982 Long term (current) use of aspirin: Secondary | ICD-10-CM | POA: Insufficient documentation

## 2013-01-09 DIAGNOSIS — K219 Gastro-esophageal reflux disease without esophagitis: Secondary | ICD-10-CM | POA: Insufficient documentation

## 2013-01-09 DIAGNOSIS — J36 Peritonsillar abscess: Secondary | ICD-10-CM | POA: Insufficient documentation

## 2013-01-09 DIAGNOSIS — R599 Enlarged lymph nodes, unspecified: Secondary | ICD-10-CM | POA: Insufficient documentation

## 2013-01-09 DIAGNOSIS — R221 Localized swelling, mass and lump, neck: Secondary | ICD-10-CM | POA: Insufficient documentation

## 2013-01-09 DIAGNOSIS — Z862 Personal history of diseases of the blood and blood-forming organs and certain disorders involving the immune mechanism: Secondary | ICD-10-CM | POA: Insufficient documentation

## 2013-01-09 DIAGNOSIS — R1310 Dysphagia, unspecified: Secondary | ICD-10-CM | POA: Insufficient documentation

## 2013-01-09 DIAGNOSIS — D509 Iron deficiency anemia, unspecified: Secondary | ICD-10-CM | POA: Insufficient documentation

## 2013-01-09 LAB — CBC WITH DIFFERENTIAL/PLATELET
Basophils Absolute: 0 10*3/uL (ref 0.0–0.1)
Basophils Relative: 1 % (ref 0–1)
Eosinophils Absolute: 0.1 10*3/uL (ref 0.0–0.7)
Eosinophils Relative: 1 % (ref 0–5)
HCT: 42.4 % (ref 36.0–46.0)
Hemoglobin: 14.6 g/dL (ref 12.0–15.0)
MCH: 32.9 pg (ref 26.0–34.0)
MCHC: 34.4 g/dL (ref 30.0–36.0)
Monocytes Absolute: 1 10*3/uL (ref 0.1–1.0)
Monocytes Relative: 11 % (ref 3–12)
RDW: 12.7 % (ref 11.5–15.5)

## 2013-01-09 LAB — BASIC METABOLIC PANEL
BUN: 11 mg/dL (ref 6–23)
Calcium: 9.5 mg/dL (ref 8.4–10.5)
Creatinine, Ser: 0.61 mg/dL (ref 0.50–1.10)
GFR calc Af Amer: >90 mL/min (ref >90)
GFR calc non Af Amer: >90 mL/min (ref >90)

## 2013-01-09 LAB — RAPID STREP SCREEN (MED CTR MEBANE ONLY): Streptococcus, Group A Screen (Direct): NEGATIVE

## 2013-01-09 MED ORDER — SODIUM CHLORIDE 0.9 % IV BOLUS (SEPSIS)
1000.0000 mL | Freq: Once | INTRAVENOUS | Status: AC
  Administered 2013-01-09: 1000 mL via INTRAVENOUS

## 2013-01-09 MED ORDER — MORPHINE SULFATE 4 MG/ML IJ SOLN
4.0000 mg | Freq: Once | INTRAMUSCULAR | Status: AC
  Administered 2013-01-09: 4 mg via INTRAVENOUS
  Filled 2013-01-09: qty 1

## 2013-01-09 MED ORDER — ONDANSETRON HCL 4 MG/2ML IJ SOLN
4.0000 mg | Freq: Once | INTRAMUSCULAR | Status: AC
  Administered 2013-01-09: 4 mg via INTRAVENOUS
  Filled 2013-01-09: qty 2

## 2013-01-09 MED ORDER — CLINDAMYCIN PHOSPHATE 900 MG/50ML IV SOLN
900.0000 mg | Freq: Once | INTRAVENOUS | Status: AC
  Administered 2013-01-09: 900 mg via INTRAVENOUS
  Filled 2013-01-09: qty 50

## 2013-01-09 MED ORDER — IOHEXOL 300 MG/ML  SOLN
75.0000 mL | Freq: Once | INTRAMUSCULAR | Status: AC | PRN
  Administered 2013-01-09: 75 mL via INTRAVENOUS

## 2013-01-09 NOTE — ED Notes (Addendum)
Pt states yesterday her throat started with scratching in the back of her throat. Pt states she took 800mg  of ibuprofen around 01:00 this morning when her pain increased to an 8/10. Pt states she was seen at urgent care and they said by the look of her throat she was too much for them to work up. Pt did have strep screen performed at urgent care that was negative. Pt does have a swollen left tonsil compared to the right, but throat is not extremely red.

## 2013-01-09 NOTE — ED Provider Notes (Signed)
History     CSN: [id number]  Arrival date & time 01/09/13  2000   First MD Initiated Contact with Patient 01/09/13 2018      Chief Complaint  Patient presents with  . Sore Throat   HPI  History provided by the patient and recent medical chart. Patient is a 52 year old female with history of GERD who presents from urgent care Center for further evaluation of sore throat and left throat swelling. Patient began having worsening sore throat around 1 PM today. She was seen at urgent care Center with concerns of possible left peritonsillar abscess she was sent for further evaluation. She states symptoms began gradually in the morning but were improved with ibuprofen until he returned around 1 PM. Since that time she has had worsening swelling and difficulty swallowing any items. She reports slight subjective fevers and chills at home. Denies any other recent symptoms of congestion or cough. Denies any vomiting or diarrhea symptoms. No recent travel or known sick contacts. She denies any other aggravating or alleviating factors. No other associated symptoms.    Past Medical History  Diagnosis Date  . Gout   . GERD (gastroesophageal reflux disease)     0ccasional heartburn  . Low iron     Past Surgical History  Procedure Laterality Date  . Abdominal hysterectomy    . Anterior and posterior repair  07/25/2011    Procedure: ANTERIOR (CYSTOCELE) AND POSTERIOR REPAIR (RECTOCELE);  Surgeon: Fortino Sic, MD;  Location: [facility];  Service: Gynecology;  Laterality: N/A;  Anterior Repair  . Burch procedure  07/25/2011    Procedure: BURCH PROCEDURE;  Surgeon: Fortino Sic, MD;  Location: [facility];  Service: Gynecology;  Laterality: N/A;    Family History  Problem Relation Age of Onset  . Clotting disorder Sister     History  Substance Use Topics  . Smoking status: Current Every Day Smoker -- 0.25 packs/day    Types: Cigarettes  . Smokeless tobacco: Not on file  . Alcohol Use: Yes   Comment: occasional    OB History   Grav Para Term Preterm Abortions TAB SAB Ect Mult Living                  Review of Systems  Constitutional: Positive for fever and chills.  HENT: Positive for sore throat. Negative for congestion, rhinorrhea, trouble swallowing and voice change.   Respiratory: Negative for cough.   Gastrointestinal: Negative for nausea and vomiting.  All other systems reviewed and are negative.    Allergies  Review of patient's allergies indicates no known allergies.  Home Medications   Current Outpatient Rx  Name  Route  Sig  Dispense  Refill  . aspirin 81 MG tablet   Oral   Take 81 mg by mouth 2 (two) times a week.         Marland Kitchen ibuprofen (ADVIL,MOTRIN) 800 MG tablet   Oral   Take 800 mg by mouth every 8 (eight) hours as needed for pain.           BP 144/100  Pulse 86  Temp(Src) 98.5 F (36.9 C) (Oral)  Resp 18  SpO2 98%  Physical Exam  Nursing note and vitals reviewed. Constitutional: She is oriented to person, place, and time. She appears well-developed and well-nourished. No distress.  HENT:  Head: Normocephalic.  There is erythema and swelling with prominence to the left tonsillar anterior pillar area. Very slight rightward deviation of the uvula. There is also mild  edema around the right tonsil. No exudate present.  Neck: Normal range of motion. Neck supple.  No meningeal signs  Cardiovascular: Normal rate and regular rhythm.   Pulmonary/Chest: Effort normal and breath sounds normal. No stridor. No respiratory distress. She has no wheezes. She has no rales.  Abdominal: Soft. There is no tenderness.  Musculoskeletal: Normal range of motion.  Lymphadenopathy:    She has cervical adenopathy.  Neurological: She is alert and oriented to person, place, and time.  Skin: Skin is warm and dry. No rash noted.  Psychiatric: She has a normal mood and affect. Her behavior is normal.    ED Course  Procedures   Results for orders placed  during the hospital encounter of 01/09/13  RAPID STREP SCREEN      Result Value Range   Streptococcus, Group A Screen (Direct) NEGATIVE  NEGATIVE  CBC WITH DIFFERENTIAL      Result Value Range   WBC 8.5  4.0 - 10.5 K/uL   RBC 4.44  3.87 - 5.11 MIL/uL   Hemoglobin 14.6  12.0 - 15.0 g/dL   HCT 16.1  09.6 - 04.5 %   MCV 95.5  78.0 - 100.0 fL   MCH 32.9  26.0 - 34.0 pg   MCHC 34.4  30.0 - 36.0 g/dL   RDW 40.9  81.1 - 91.4 %   Platelets 185  150 - 400 K/uL   Neutrophils Relative % 63  43 - 77 %   Neutro Abs 5.4  1.7 - 7.7 K/uL   Lymphocytes Relative 25  12 - 46 %   Lymphs Abs 2.1  0.7 - 4.0 K/uL   Monocytes Relative 11  3 - 12 %   Monocytes Absolute 1.0  0.1 - 1.0 K/uL   Eosinophils Relative 1  0 - 5 %   Eosinophils Absolute 0.1  0.0 - 0.7 K/uL   Basophils Relative 1  0 - 1 %   Basophils Absolute 0.0  0.0 - 0.1 K/uL  BASIC METABOLIC PANEL      Result Value Range   Sodium 136  135 - 145 mEq/L   Potassium 4.1  3.5 - 5.1 mEq/L   Chloride 101  96 - 112 mEq/L   CO2 26  19 - 32 mEq/L   Glucose, Bld 93  70 - 99 mg/dL   BUN 11  6 - 23 mg/dL   Creatinine, Ser 7.82  0.50 - 1.10 mg/dL   Calcium 9.5  8.4 - 95.6 mg/dL   GFR calc non Af Amer >90  >90 mL/min   GFR calc Af Amer >90  >90 mL/min       Ct Soft Tissue Neck W Contrast  01/09/2013   *RADIOLOGY REPORT*  Clinical Data: Sore throat; odynophagia.  CT NECK WITH CONTRAST  Technique:  Multidetector CT imaging of the neck was performed with intravenous contrast.  Contrast: 75mL OMNIPAQUE IOHEXOL 300 MG/ML  SOLN  Comparison: None.  Findings: There is an irregular collection of fluid at the left palatine tonsil, measuring approximately 1.6 x 0.6 x 1.1 cm.  It is not particularly well organized, though minimal peripheral enhancement is noted, suggesting an evolving abscess.  Associated soft tissue edema is noted, along the left palatine tonsil.  There is mass effect on the oropharynx and upper hypopharynx, with partial effacement of the left  vallecula and piriform sinus.  An adjacent tonsillolith is incidentally seen. There is soft tissue inflammation along the left parapharyngeal fat planes, extending to the level of  the anterior left parotid gland.  Significant associated soft tissue edema is noted tracking inferiorly along the left side of the neck, and minimally to the floor of the mouth, raising concern for increased risk of Ludwig's angina.  The right parapharyngeal fat planes are unremarkable in appearance. The nasopharynx is within normal limits. There is no evidence of vascular compromise.  The thyroid gland is unremarkable in appearance.  Borderline prominent left-sided cervical nodes are seen, measuring up to 1.1 cm in short axis.  There is no evidence of fracture or dislocation.  The maxilla and mandible appear intact.  The nasal bone is unremarkable in appearance.  The visualized dentition demonstrates no acute abnormality.  An anterior disc osteophyte complex is incidentally noted at C5-C6.  The orbits are intact bilaterally.  The visualized paranasal sinuses and mastoid air cells are well-aerated.  The parotid and submandibular glands are within normal limits.  The visualized lung apices are essentially clear.  The visualized portions of the brain are unremarkable in appearance.  IMPRESSION:  1.  Irregular collection of fluid at the left palatine tonsil, measuring 1.6 x 0.6 x 1.1 cm, with minimal peripheral enhancement, concerning for evolving abscess.  2.  Soft tissue edema along the left palatine tonsil extends along the left parapharyngeal fat planes and also inferiorly along the left side of the neck, with minimal extension to the floor of the mouth.  This raises concern for increased risk of Ludwig's angina. 3.  Partial effacement of the left vallecula and piriform sinus, with mass effect on the oropharynx and upper hypopharynx. 4.  Borderline prominent left-sided cervical nodes noted, measuring up to 1.1 cm in short axis.  These  results were called by telephone on 01/09/2013 at 11:16 p.m. to [facility] PA, who verbally acknowledged these results.   Original Report Authenticated By: Tonia Ghent, M.D.     1. Peritonsillar abscess       MDM  9:00 p.m. patient seen and evaluated. Patient appears uncomfortable but in no acute distress.  Pt feeling better after meds. CT pending  CT with signs of PTA.  Pt discussed with Attending Physician.  Will consult ENT.  Spoke with ENT.  They will see pt and perform I&D.  Dr. Emeline Darling with ENT has seen patient and performed I&D. Patient now ready to be discharged. He has provided prescriptions and followup instructions.     Angus Seller, PA-C 01/10/13 [phone]

## 2013-01-09 NOTE — ED Notes (Signed)
Pt c/o sore throat onset Tuesday sxs include: odynophagia... Taking ibup 800mg  w/temp relief Denies: fevers She is alert and oriented w/no signs of acute distress.

## 2013-01-09 NOTE — ED Provider Notes (Addendum)
History     CSN: [id number]  Arrival date & time 01/09/13  [place]   First MD Initiated Contact with Patient 01/09/13 1916      Chief Complaint  Patient presents with  . Sore Throat    (Consider location/radiation/quality/duration/timing/severity/associated sxs/prior treatment) HPI Comments: Patient presents urgent care describing that since yesterday she's been having a sore throat. She woke up this morning describing it she has developed unbearable pain and is barely able to swallow even fluids. Her voice has change, described pain is mainly on the left side of her throat and her neck area. Denies any recent respiratory symptoms, shortness of breath cough or fevers.  Patient is a 52 y.o. female presenting with pharyngitis. The history is provided by the patient.  Sore Throat This is a new problem. The current episode started yesterday. The problem occurs constantly. The problem has been gradually worsening. Pertinent negatives include no abdominal pain and no shortness of breath. The symptoms are aggravated by swallowing. Nothing relieves the symptoms. She has tried nothing for the symptoms.    Past Medical History  Diagnosis Date  . Gout   . GERD (gastroesophageal reflux disease)     0ccasional heartburn  . Low iron     Past Surgical History  Procedure Laterality Date  . Abdominal hysterectomy    . Anterior and posterior repair  07/25/2011    Procedure: ANTERIOR (CYSTOCELE) AND POSTERIOR REPAIR (RECTOCELE);  Surgeon: Fortino Sic, MD;  Location: [facility];  Service: Gynecology;  Laterality: N/A;  Anterior Repair  . Burch procedure  07/25/2011    Procedure: BURCH PROCEDURE;  Surgeon: Fortino Sic, MD;  Location: [facility];  Service: Gynecology;  Laterality: N/A;    Family History  Problem Relation Age of Onset  . Clotting disorder Sister     History  Substance Use Topics  . Smoking status: Current Every Day Smoker -- 0.25 packs/day    Types: Cigarettes  . Smokeless  tobacco: Not on file  . Alcohol Use: Yes     Comment: occasional    OB History   Grav Para Term Preterm Abortions TAB SAB Ect Mult Living                  Review of Systems  Constitutional: Positive for activity change and appetite change. Negative for fever and fatigue.  HENT: Positive for sore throat, trouble swallowing and voice change. Negative for hearing loss, ear pain, nosebleeds, congestion, sinus pressure, tinnitus and ear discharge.   Respiratory: Negative for shortness of breath.   Gastrointestinal: Negative for nausea, vomiting and abdominal pain.  Skin: Negative for rash.  Hematological: Positive for adenopathy. Does not bruise/bleed easily.    Allergies  Review of patient's allergies indicates no known allergies.  Home Medications   Current Outpatient Rx  Name  Route  Sig  Dispense  Refill  . azithromycin (ZITHROMAX) 250 MG tablet   Oral   Take 1 tablet (250 mg total) by mouth daily. Take first 2 tablets together, then 1 every day until finished.   6 tablet   0   . dextromethorphan-guaiFENesin (MUCINEX DM) 30-600 MG per 12 hr tablet   Oral   Take 1 tablet by mouth every 12 (twelve) hours.         . febuxostat (ULORIC) 40 MG tablet   Oral   Take 40 mg by mouth daily.           BP 135/62  Pulse 78  Temp(Src) 99.3 F (  37.4 C) (Oral)  Resp 20  SpO2 97%  Physical Exam  Constitutional: Vital signs are normal.  Non-toxic appearance. She has a sickly appearance. She does not appear ill. No distress.  HENT:  Head: Normocephalic.  Mouth/Throat: Uvula is midline. Oropharyngeal exudate, posterior oropharyngeal edema and posterior oropharyngeal erythema present.    Eyes: Conjunctivae are normal.  Neck: Neck supple.  Lymphadenopathy:    She has cervical adenopathy.  Neurological: She is alert.  Skin: No rash noted. No erythema.    ED Course  Procedures (including critical care time)  Labs Reviewed  POCT RAPID STREP A ([facility] ONLY)   No  results found.   1. Peritonsillar cellulitis     Strep negative-  MDM  Patient symptomatic for less than 48 hours. Exam was consistent with significant peritonsillar cellulitis/ edema  Abscess?. Patient will be transferred to the emergency department, will probably need ENT consultation          Jimmie Molly, MD 01/09/13 1959  Jimmie Molly, MD 01/09/13 2000

## 2013-01-09 NOTE — ED Notes (Signed)
Suture cart and ENT cart placed in room

## 2013-01-10 MED ORDER — SODIUM CHLORIDE 0.9 % IV SOLN
3.0000 g | Freq: Once | INTRAVENOUS | Status: AC
  Administered 2013-01-10: 3 g via INTRAVENOUS
  Filled 2013-01-10: qty 3

## 2013-01-10 MED ORDER — DEXAMETHASONE SODIUM PHOSPHATE 10 MG/ML IJ SOLN
10.0000 mg | Freq: Once | INTRAMUSCULAR | Status: AC
  Administered 2013-01-10: 10 mg via INTRAVENOUS
  Filled 2013-01-10: qty 1

## 2013-01-10 NOTE — ED Provider Notes (Signed)
Medical screening examination/treatment/procedure(s) were performed by non-physician practitioner and as supervising physician I was immediately available for consultation/collaboration.  Geoffery Lyons, MD 01/10/13 [phone]

## 2013-01-10 NOTE — Consult Note (Signed)
Sue Holloway, Sue Holloway [id number] 11/23/1960 Sue Lyons, MD  Reason for Consult: left peritonsillar abscess  HPI: seen in consultation at the request of [name] for several days of left sore throat and left ear pain. CT scan in ER showed 1.5cm letf peritonsillar/tonsillar abscess. ENt consulted for I&D  Allergies: No Known Allergies  ROS: sore throat, ear pain, dysphagia, otherwise negative x 10 systems except per HPI  PMH:  Past Medical History  Diagnosis Date  . Gout   . GERD (gastroesophageal reflux disease)     0ccasional heartburn  . Low iron     FH:  Family History  Problem Relation Age of Onset  . Clotting disorder Sister     SH:  History   Social History  . Marital Status: Single    Spouse Name: N/A    Number of Children: N/A  . Years of Education: N/A   Occupational History  . Not on file.   Social History Main Topics  . Smoking status: Current Every Day Smoker -- 0.25 packs/day    Types: Cigarettes  . Smokeless tobacco: Not on file  . Alcohol Use: Yes     Comment: occasional  . Drug Use: No  . Sexually Active:    Other Topics Concern  . Not on file   Social History Narrative  . No narrative on file    PSH:  Past Surgical History  Procedure Laterality Date  . Abdominal hysterectomy    . Anterior and posterior repair  07/25/2011    Procedure: ANTERIOR (CYSTOCELE) AND POSTERIOR REPAIR (RECTOCELE);  Surgeon: Fortino Sic, MD;  Location: [facility];  Service: Gynecology;  Laterality: N/A;  Anterior Repair  . Burch procedure  07/25/2011    Procedure: BURCH PROCEDURE;  Surgeon: Fortino Sic, MD;  Location: [facility];  Service: Gynecology;  Laterality: N/A;    Physical  Exam: CN 2-12 grossly intact and symmetric. Muffled voice EAC/TMs normal BL. Oral cavity with 3+ cryptic tonsils BL, left tonsil with anterior pillar edema and erythema and deviation of uvula to right.  Skin warm and dry. Nasal cavity without polyps or purulence. External nose  and ears without masses or lesions. EOMI, PERRLA. Neck supple with no masses or lesions.  Procedure Note: 42700 Informed verbal consent was obtained after explaining the risks (including bleeding and infection), benefits and alternatives of the procedure. Verbal timeout was performed prior to the procedure. The oral cavity was anesthetized using topical benzocaine followed by injected lidocaine with epinephrine. A needle and syringe were used to localize the abscess on the left side. The 15 blade was then used to incise the left palatoglossus and mucosa and a curved hemostat was used to widely open the abscess. The patient tolerated the procedure with no immediate complications and was able to take PO prior to discharge.  A/P: s/p incision and drainage of left peritonsillar abscess. Will give decadron, clindamycin, and Unasyn in the ER and I wrote patient prescriptions for clindamycin, PRN hydrocodone/acetaminophen, and a prednisolone taper to go home with. Can be discharged home and follow up with ENT in 1-2 weeks. Patient should consider outpatient tonsillectomy in the future.   Sue Holloway 01/10/2013 12:19 AM

## 2013-01-10 NOTE — ED Notes (Signed)
ENT provider at bedside.  

## 2013-01-11 LAB — CULTURE, GROUP A STREP

## 2014-01-20 IMAGING — CR DG CHEST 2V
2 series · 2 of 2 positions shown · non-contrast
Comparison: None.

CLINICAL DATA: 51-year-old female with cough.  Shortness of breath
and wheezing.

CHEST - 2 VIEW

[w chest pa]
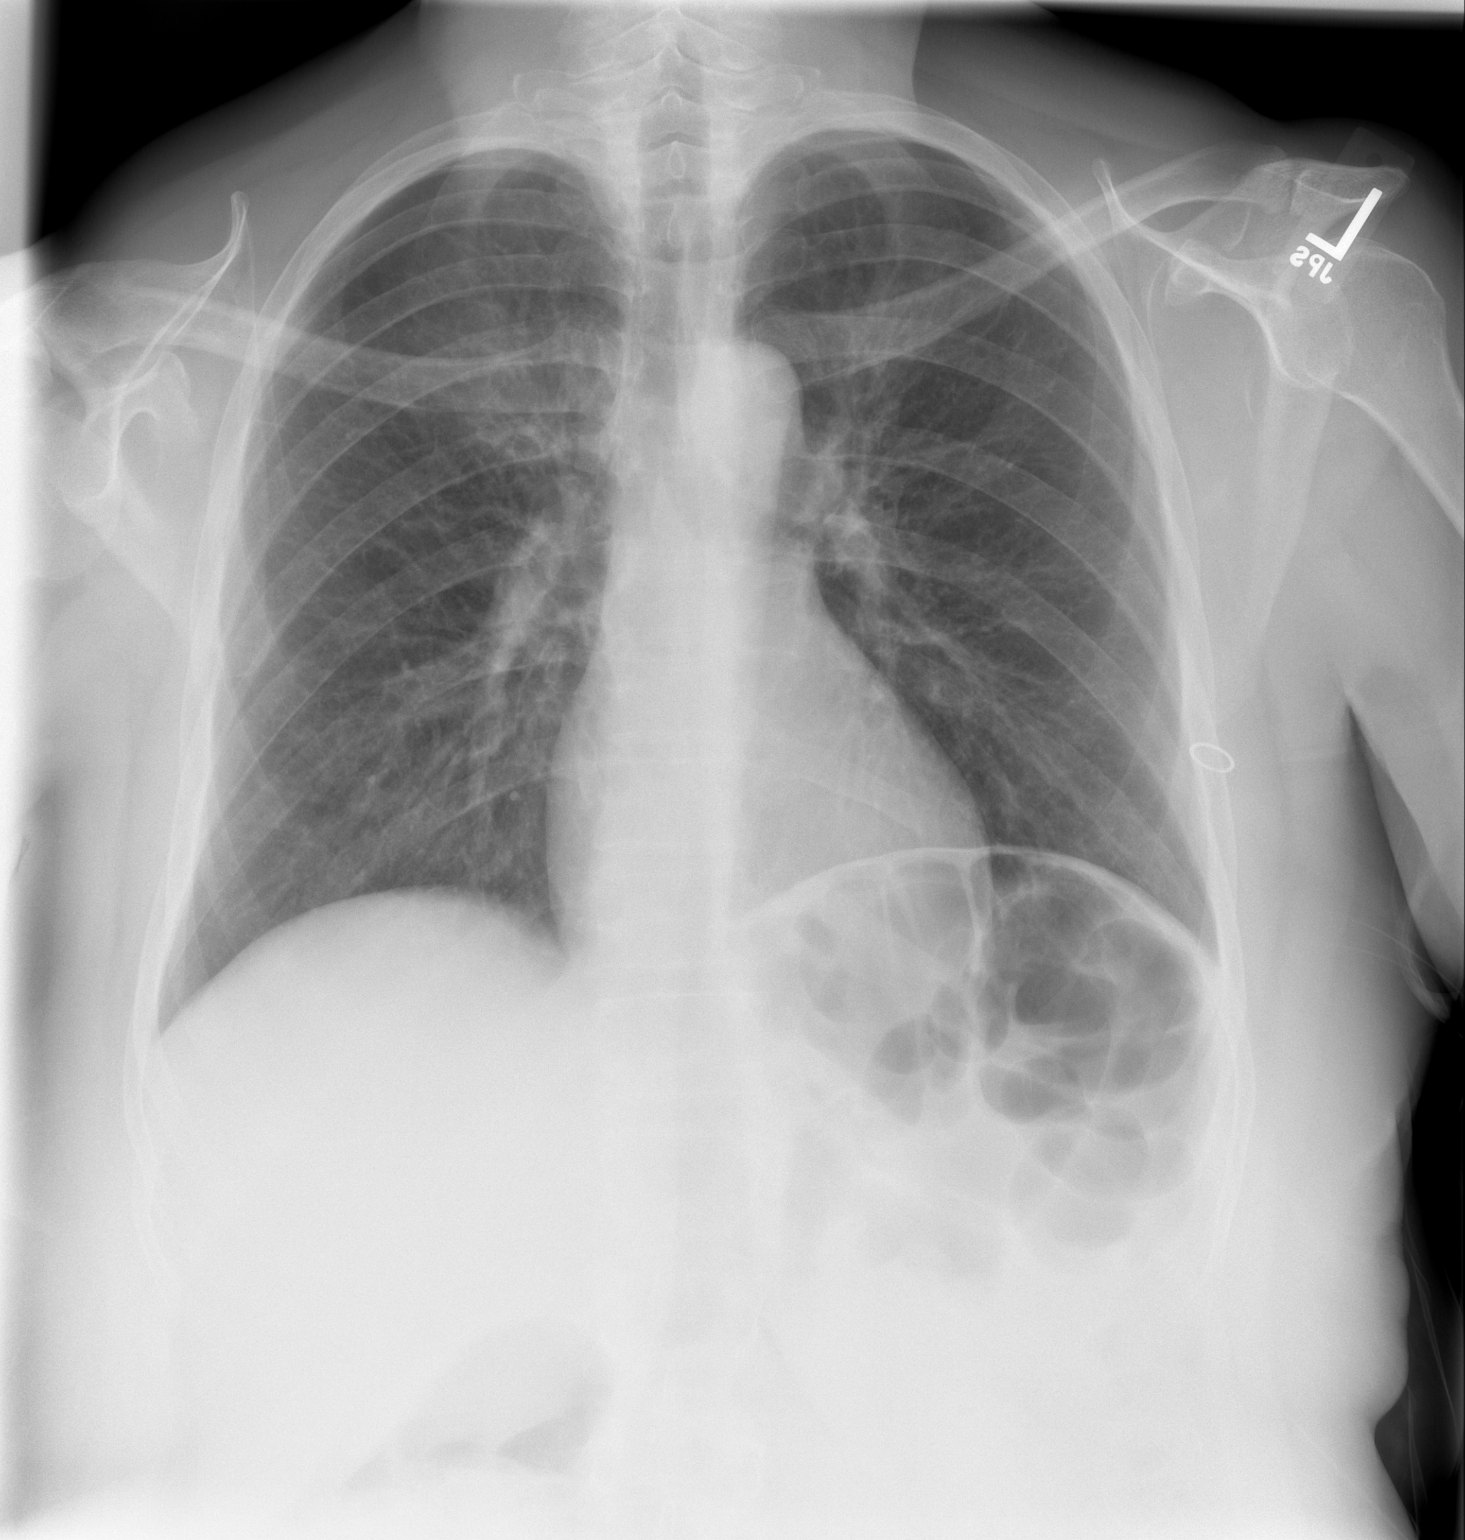

[w chest lat]
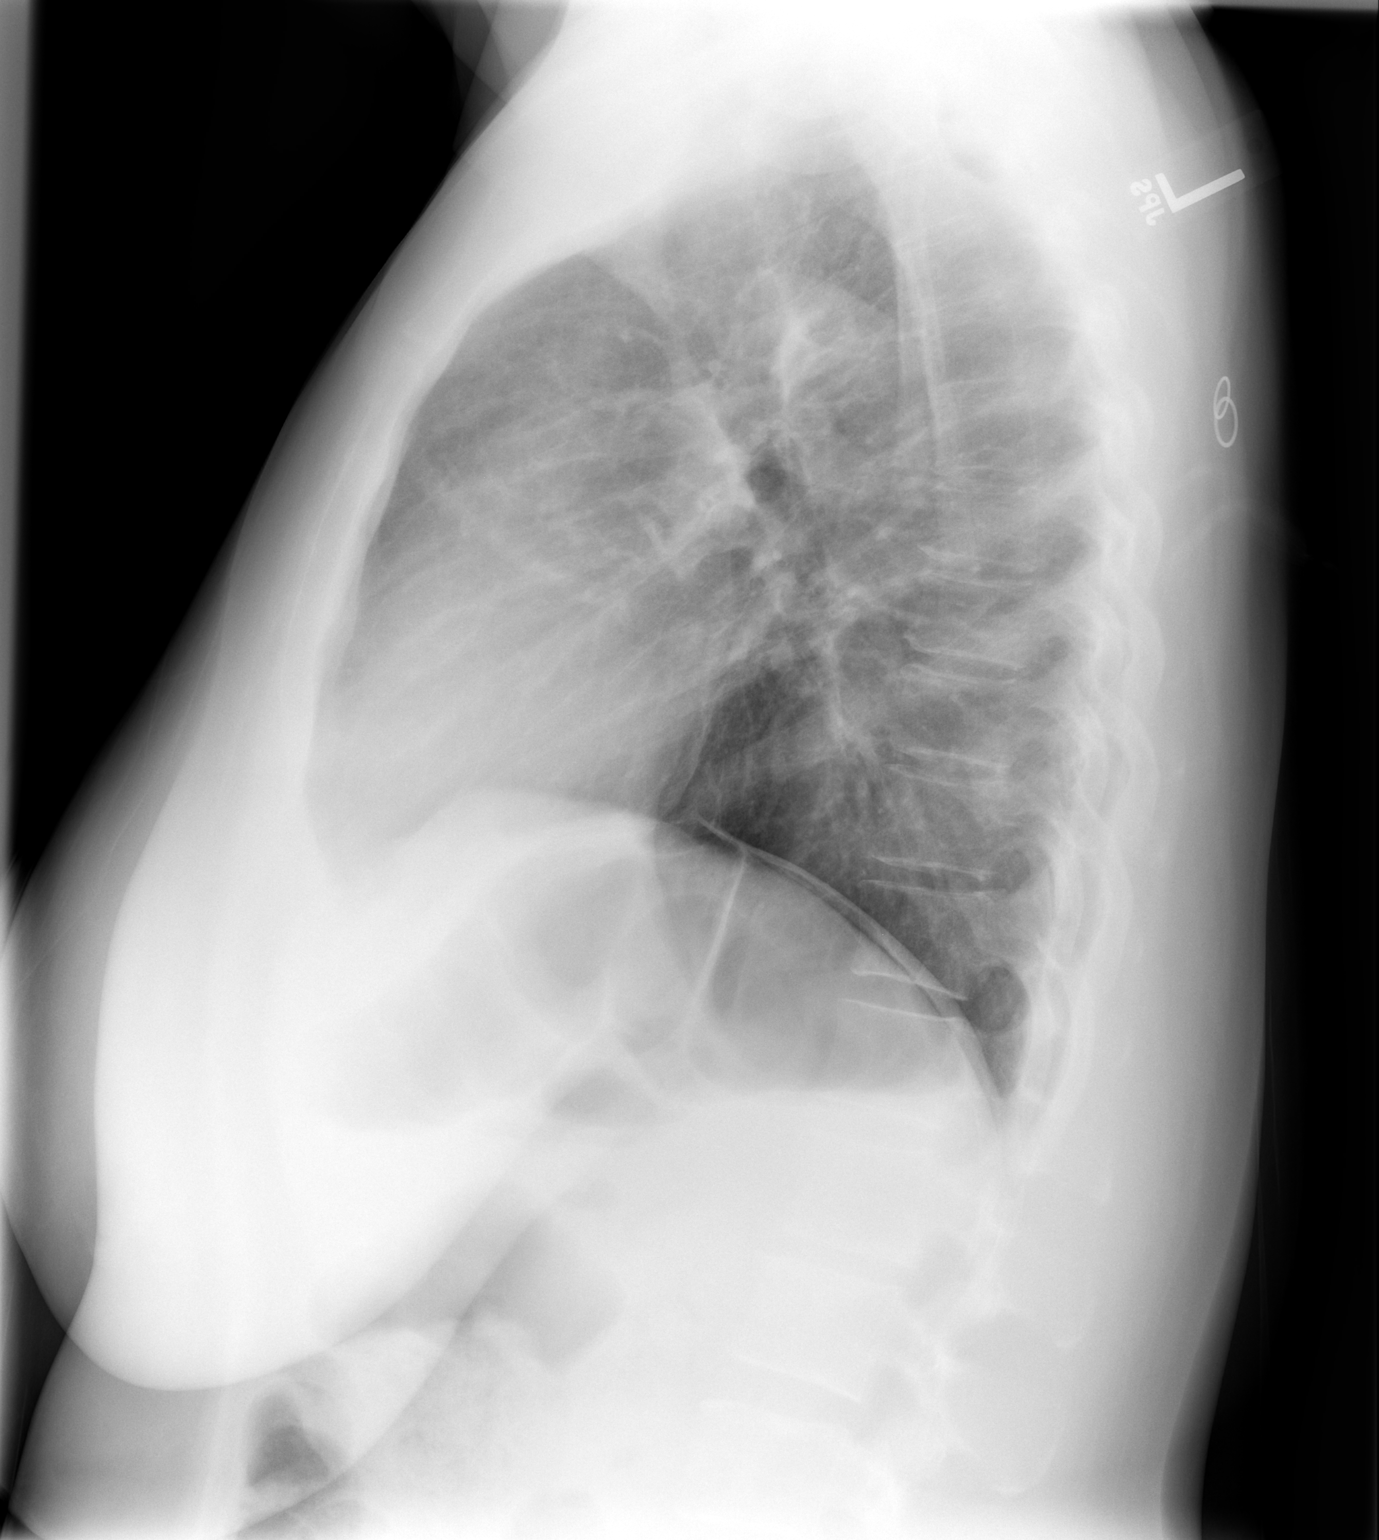

[2 of 2 positions shown; findings below may reference images not displayed]

FINDINGS: Somewhat low lung volumes.  Mild elevation of the left
hemidiaphragm with injury and colon.  No pneumothorax or pleural
effusion.  No confluent pulmonary opacity or edema.  Mild increased
interstitial markings, favor chronic. No acute osseous abnormality
identified.
IMPRESSION: Mild increased interstitial markings are favored be chronic but
might represent viral or atypical respiratory infection.  No other
acute cardiopulmonary abnormality.

## 2014-11-18 ENCOUNTER — Emergency Department (INDEPENDENT_AMBULATORY_CARE_PROVIDER_SITE_OTHER)
Admission: EM | Admit: 2014-11-18 | Discharge: 2014-11-18 | Disposition: A | Discharge status: Home / Self Care | Payer: BLUE CROSS/BLUE SHIELD | Source: Home / Self Care | Attending: Family Medicine | Admitting: Family Medicine

## 2014-11-18 ENCOUNTER — Encounter (HOSPITAL_COMMUNITY): Payer: Self-pay | Admitting: Emergency Medicine

## 2014-11-18 DIAGNOSIS — L03012 Cellulitis of left finger: Secondary | ICD-10-CM | POA: Diagnosis not present

## 2014-11-18 DIAGNOSIS — Z23 Encounter for immunization: Secondary | ICD-10-CM | POA: Diagnosis not present

## 2014-11-18 MED ORDER — TETANUS-DIPHTH-ACELL PERTUSSIS 5-2.5-18.5 LF-MCG/0.5 IM SUSP
INTRAMUSCULAR | Status: AC
  Filled 2014-11-18: qty 0.5

## 2014-11-18 MED ORDER — CEFUROXIME AXETIL 250 MG PO TABS: 250.0000 mg | ORAL_TABLET | Freq: Two times a day (BID) | ORAL | Status: DC

## 2014-11-18 MED ORDER — TETANUS-DIPHTH-ACELL PERTUSSIS 5-2.5-18.5 LF-MCG/0.5 IM SUSP
0.5000 mL | Freq: Once | INTRAMUSCULAR | Status: AC
  Administered 2014-11-18: 0.5 mL via INTRAMUSCULAR

## 2014-11-18 NOTE — Discharge Instructions (Signed)
Warm soaks 3-4 times daily  Paronychia Paronychia is an inflammatory reaction involving the folds of the skin surrounding the fingernail. This is commonly caused by an infection in the skin around a nail. The most common cause of paronychia is frequent wetting of the hands (as seen with bartenders, food servers, nurses or others who wet their hands). This makes the skin around the fingernail susceptible to infection by bacteria (germs) or fungus. Other predisposing factors are:  Aggressive manicuring.  Nail biting.  Thumb sucking. The most common cause is a staphylococcal (a type of germ) infection, or a fungal (Candida) infection. When caused by a germ, it usually comes on suddenly with redness, swelling, pus and is often painful. It may get under the nail and form an abscess (collection of pus), or form an abscess around the nail. If the nail itself is infected with a fungus, the treatment is usually prolonged and may require oral medicine for up to one year. Your caregiver will determine the length of time treatment is required. The paronychia caused by bacteria (germs) may largely be avoided by not pulling on hangnails or picking at cuticles. When the infection occurs at the tips of the finger it is called felon. When the cause of paronychia is from the herpes simplex virus (HSV) it is called herpetic whitlow. TREATMENT  When an abscess is present treatment is often incision and drainage. This means that the abscess must be cut open so the pus can get out. When this is done, the following home care instructions should be followed. HOME CARE INSTRUCTIONS   It is important to keep the affected fingers very dry. Rubber or plastic gloves over cotton gloves should be used whenever the hand must be placed in water.  Keep wound clean, dry and dressed as suggested by your caregiver between warm soaks or warm compresses.  Soak in warm water for fifteen to twenty minutes three to four times per day for  bacterial infections. Fungal infections are very difficult to treat, so often require treatment for long periods of time.  For bacterial (germ) infections take antibiotics (medicine which kill germs) as directed and finish the prescription, even if the problem appears to be solved before the medicine is gone.  Only take over-the-counter or prescription medicines for pain, discomfort, or fever as directed by your caregiver. SEEK IMMEDIATE MEDICAL CARE IF:  You have redness, swelling, or increasing pain in the wound.  You notice pus coming from the wound.  You have a fever.  You notice a bad smell coming from the wound or dressing. Document Released: 01/18/2001 Document Revised: 10/17/2011 Document Reviewed: 09/19/2008 [facility] [employer]Care Patient Information 2015 [employer], [place]. This information is not intended to replace advice given to you by your health care provider. Make sure you discuss any questions you have with your health care provider.

## 2014-11-18 NOTE — ED Provider Notes (Signed)
CSN: [id number]     Arrival date & time 11/18/14  1032 History   First MD Initiated Contact with Patient 11/18/14 1129     Chief Complaint  Patient presents with  . thumb pain    (Consider location/radiation/quality/duration/timing/severity/associated sxs/prior Treatment) HPI         54 year old female presents complaining of left thumb pain. She started having mild pain and swelling in the left thumb on Saturday 3 days ago and has gotten gradually worse. She has never had this before. She has a history of gout. She denies any trauma to the finger.    Past Medical History  Diagnosis Date  . Gout   . GERD (gastroesophageal reflux disease)     0ccasional heartburn  . Low iron    Past Surgical History  Procedure Laterality Date  . Abdominal hysterectomy    . Anterior and posterior repair  07/25/2011    Procedure: ANTERIOR (CYSTOCELE) AND POSTERIOR REPAIR (RECTOCELE);  Surgeon: Fortino Sic, MD;  Location: [facility];  Service: Gynecology;  Laterality: N/A;  Anterior Repair  . Burch procedure  07/25/2011    Procedure: BURCH PROCEDURE;  Surgeon: Fortino Sic, MD;  Location: [facility];  Service: Gynecology;  Laterality: N/A;   Family History  Problem Relation Age of Onset  . Clotting disorder Sister    History  Substance Use Topics  . Smoking status: Current Every Day Smoker -- 0.10 packs/day    Types: Cigarettes  . Smokeless tobacco: Not on file  . Alcohol Use: Yes     Comment: occasional   OB History    No data available     Review of Systems  Musculoskeletal:       Left thumb pain and swelling  All other systems reviewed and are negative.   Allergies  Review of patient's allergies indicates no known allergies.  Home Medications   Prior to Admission medications   Medication Sig Start Date End Date Taking? Authorizing Provider  aspirin 81 MG tablet Take 81 mg by mouth 2 (two) times a week.   Yes Historical Provider, MD  ibuprofen (ADVIL,MOTRIN) 800 MG tablet Take  800 mg by mouth every 8 (eight) hours as needed for pain.   Yes Historical Provider, MD  cefUROXime (CEFTIN) 250 MG tablet Take 1 tablet (250 mg total) by mouth 2 (two) times daily with a meal. 11/18/14   Adrian Blackwater Kennedee Kitzmiller, PA-C   BP 110/75 mmHg  Pulse 70  Temp(Src) 97.9 F (36.6 C) (Oral)  Resp 12  SpO2 100% Physical Exam  Constitutional: She is oriented to person, place, and time. Vital signs are normal. She appears well-developed and well-nourished. No distress.  HENT:  Head: Normocephalic and atraumatic.  Pulmonary/Chest: Effort normal. No respiratory distress.  Musculoskeletal:       Left hand: She exhibits tenderness (tenderness in the lateral nail fold and in the pad of the thumb. Erythema, paronychia).  Neurological: She is alert and oriented to person, place, and time. She has normal strength. Coordination normal.  Skin: Skin is warm and dry. No rash noted. She is not diaphoretic.  Psychiatric: She has a normal mood and affect. Judgment normal.  Nursing note and vitals reviewed.   ED Course  Procedures (including critical care time) Labs Review Labs Reviewed  CULTURE, ROUTINE-ABSCESS    Imaging Review No results found.   MDM   1. Paronychia of left thumb    The skin was cleansed with alcohol swab and a single straight incision down  the lateral nail fold was performed with an 18-gauge needle with a moderate amount of purulent drainage and relief of symptoms. No anesthetic was required. Treat with warm soaks and Ceftin twice a day for week. Follow-up if worsening. Her Profen or Tylenol for pain  Meds ordered this encounter  Medications  . Tdap (BOOSTRIX) injection 0.5 mL    Sig:   . cefUROXime (CEFTIN) 250 MG tablet    Sig: Take 1 tablet (250 mg total) by mouth 2 (two) times daily with a meal.    Dispense:  14 tablet    Refill:  0       Graylon GoodZachary H Magnolia Mattila, PA-C 11/18/14 1225

## 2014-11-18 NOTE — ED Notes (Signed)
Pt started feeling tingling in her left thumb on Saturday.  Swelling was noticed on Sunday, and by last night it was throbbing.  Pt denies any injury to the thumb, but does report a history of gout.

## 2014-11-22 LAB — CULTURE, ROUTINE-ABSCESS: Gram Stain: NONE SEEN

## 2014-11-24 NOTE — ED Notes (Signed)
Abscess culture L thumb: Rare Klebsiella Ozaenae and mod. Strep (coagulase neg.). Pt. adequately treated with I and D and  Ceftin. Vassie MoselleYork, Aziah Kaiser M 11/24/2014

## 2020-01-27 ENCOUNTER — Ambulatory Visit: Payer: BLUE CROSS/BLUE SHIELD | Admitting: Internal Medicine

## 2020-02-19 ENCOUNTER — Encounter: Payer: Self-pay | Admitting: Internal Medicine

## 2020-02-19 ENCOUNTER — Ambulatory Visit (INDEPENDENT_AMBULATORY_CARE_PROVIDER_SITE_OTHER): Payer: 59 | Admitting: Internal Medicine

## 2020-02-19 ENCOUNTER — Other Ambulatory Visit: Payer: Self-pay

## 2020-02-19 DIAGNOSIS — M109 Gout, unspecified: Secondary | ICD-10-CM | POA: Diagnosis not present

## 2020-02-19 DIAGNOSIS — K219 Gastro-esophageal reflux disease without esophagitis: Secondary | ICD-10-CM

## 2020-02-19 DIAGNOSIS — D509 Iron deficiency anemia, unspecified: Secondary | ICD-10-CM

## 2020-02-19 DIAGNOSIS — E119 Type 2 diabetes mellitus without complications: Secondary | ICD-10-CM

## 2020-02-19 NOTE — Assessment & Plan Note (Signed)
Avoid triggers Will monitor 

## 2020-02-19 NOTE — Progress Notes (Signed)
HPI  Pt presents to the clinic today to establish care and for management of the conditions listed below. She is transferring care from [facility].  GERD: Triggered by tomato based foods. She does not taken anything OTC for this.   Gout: Mainly in her knees and feet. She has not had an issue lately.  Iron Deficiency Anemia: She reports she is currently not having an issue. She is not taking an iron supplement OTC at this time.  DM 2: She has not had her A1C checked recently. She was on Metformin in the past but has not been on that since she lost a significant amount of weights (80 lbs).  Flu: 05/2018 Tetanus: 2016 Covid: 11/2019, 11/2019 Pap Smear: 2018 Mammogram: 2018 Bone Density: > 5 years ago Colon Screening: 2018 Vision Screening:  as needed Dentist: as needed  Past Medical History:  Diagnosis Date  . GERD (gastroesophageal reflux disease)    0ccasional heartburn  . Gout   . Low iron     Current Outpatient Medications  Medication Sig Dispense Refill  . Ginger, Zingiber officinalis, (GINGER PO) Take by mouth.    . Turmeric (QC TUMERIC COMPLEX PO) Take by mouth.     No current facility-administered medications for this visit.    No Known Allergies  Family History  Problem Relation Age of Onset  . Clotting disorder Sister     Social History   Socioeconomic History  . Marital status: Single    Spouse name: Not on file  . Number of children: Not on file  . Years of education: Not on file  . Highest education level: Not on file  Occupational History  . Not on file  Tobacco Use  . Smoking status: Current Every Day Smoker    Packs/day: 0.50    Types: Cigarettes  . Smokeless tobacco: Never Used  Substance and Sexual Activity  . Alcohol use: Yes    Comment: occasional  . Drug use: No  . Sexual activity: Not on file  Other Topics Concern  . Not on file  Social History Narrative  . Not on file   Social Determinants of Health   Financial Resource  Strain:   . Difficulty of Paying Living Expenses:   Food Insecurity:   . Worried About Programme researcher, broadcasting/film/video in the Last Year:   . Barista in the Last Year:   Transportation Needs:   . Freight forwarder (Medical):   Marland Kitchen Lack of Transportation (Non-Medical):   Physical Activity:   . Days of Exercise per Week:   . Minutes of Exercise per Session:   Stress:   . Feeling of Stress :   Social Connections:   . Frequency of Communication with Friends and Family:   . Frequency of Social Gatherings with Friends and Family:   . Attends Religious Services:   . Active Member of Clubs or Organizations:   . Attends Banker Meetings:   Marland Kitchen Marital Status:   Intimate Partner Violence:   . Fear of Current or Ex-Partner:   . Emotionally Abused:   Marland Kitchen Physically Abused:   . Sexually Abused:     ROS:  Constitutional: Denies fever, malaise, fatigue, headache or abrupt weight changes.  HEENT: Denies eye pain, eye redness, ear pain, ringing in the ears, wax buildup, runny nose, nasal congestion, bloody nose, or sore throat. Respiratory: Denies difficulty breathing, shortness of breath, cough or sputum production.   Cardiovascular: Denies chest pain, chest tightness, palpitations  or swelling in the hands or feet.  Gastrointestinal: Pt reports intermittent reflux. Denies abdominal pain, bloating, constipation, diarrhea or blood in the stool.  GU: Denies frequency, urgency, pain with urination, blood in urine, odor or discharge. Musculoskeletal: Denies decrease in range of motion, difficulty with gait, muscle pain or joint pain and swelling.  Skin: Denies redness, rashes, lesions or ulcercations.  Neurological: Denies dizziness, difficulty with memory, difficulty with speech or problems with balance and coordination.  Psych: Denies anxiety, depression, SI/HI.  No other specific complaints in a complete review of systems (except as listed in HPI above).  PE:  BP 116/78   Pulse 84    Temp 98.3 F (36.8 C) (Temporal)   Wt 190 lb (86.2 kg)   SpO2 98%   BMI 28.47 kg/m   Wt Readings from Last 3 Encounters:  07/25/11 244 lb (110.7 kg)  07/22/11 244 lb (110.7 kg)    General: Appears her stated age, well developed, well nourished in NAD. Skin: Dry and intact. HEENT: Head: normal shape and size; Eyes: sclera white, no icterus, conjunctiva pink, PERRLA and EOMs intact;  Cardiovascular: Normal rate and rhythm. S1,S2 noted.  No murmur, rubs or gallops noted.  Pulmonary/Chest: Normal effort and positive vesicular breath sounds. No respiratory distress. No wheezes, rales or ronchi noted.  Musculoskeletal:  No difficulty with gait.  Neurological: Alert and oriented. Psychiatric: Mood and affect normal. Behavior is normal. Judgment and thought content normal.    BMET    Component Value Date/Time   NA 136 01/09/2013 2117   K 4.1 01/09/2013 2117   CL 101 01/09/2013 2117   CO2 26 01/09/2013 2117   GLUCOSE 93 01/09/2013 2117   BUN 11 01/09/2013 2117   CREATININE 0.61 01/09/2013 2117   CALCIUM 9.5 01/09/2013 2117   GFRNONAA >90 01/09/2013 2117   GFRAA >90 01/09/2013 2117    Lipid Panel  No results found for: CHOL, TRIG, HDL, CHOLHDL, VLDL, LDLCALC  CBC    Component Value Date/Time   WBC 8.5 01/09/2013 2117   RBC 4.44 01/09/2013 2117   HGB 14.6 01/09/2013 2117   HCT 42.4 01/09/2013 2117   PLT 185 01/09/2013 2117   MCV 95.5 01/09/2013 2117   MCH 32.9 01/09/2013 2117   MCHC 34.4 01/09/2013 2117   RDW 12.7 01/09/2013 2117   LYMPHSABS 2.1 01/09/2013 2117   MONOABS 1.0 01/09/2013 2117   EOSABS 0.1 01/09/2013 2117   BASOSABS 0.0 01/09/2013 2117    Hgb A1C No results found for: HGBA1C   Assessment and Plan:   Nicki Reaper, NP  This visit occurred during the SARS-CoV-2 public health emergency.  Safety protocols were in place, including screening questions prior to the visit, additional usage of staff PPE, and extensive cleaning of exam room while observing  appropriate contact time as indicated for disinfecting solutions.

## 2020-02-19 NOTE — Assessment & Plan Note (Signed)
Will check A1C at annual exam 

## 2020-02-19 NOTE — Assessment & Plan Note (Signed)
Will check uric acid with annual exam

## 2020-02-19 NOTE — Patient Instructions (Signed)

## 2020-02-19 NOTE — Assessment & Plan Note (Signed)
Will check CBC with annual exam 

## 2020-04-03 ENCOUNTER — Other Ambulatory Visit: Payer: Self-pay | Admitting: Internal Medicine

## 2020-04-08 ENCOUNTER — Other Ambulatory Visit: Payer: Self-pay

## 2020-04-14 ENCOUNTER — Other Ambulatory Visit: Payer: Self-pay

## 2020-04-20 ENCOUNTER — Encounter: Payer: Self-pay | Admitting: Internal Medicine

## 2020-04-20 ENCOUNTER — Ambulatory Visit (INDEPENDENT_AMBULATORY_CARE_PROVIDER_SITE_OTHER): Payer: 59 | Admitting: Internal Medicine

## 2020-04-20 ENCOUNTER — Other Ambulatory Visit: Payer: Self-pay

## 2020-04-20 VITALS — BP 120/78 | HR 86 | Temp 97.9°F | Wt 191.0 lb

## 2020-04-20 DIAGNOSIS — E119 Type 2 diabetes mellitus without complications: Secondary | ICD-10-CM | POA: Diagnosis not present

## 2020-04-20 DIAGNOSIS — Z23 Encounter for immunization: Secondary | ICD-10-CM | POA: Diagnosis not present

## 2020-04-20 DIAGNOSIS — Z0000 Encounter for general adult medical examination without abnormal findings: Secondary | ICD-10-CM

## 2020-04-20 DIAGNOSIS — Z1231 Encounter for screening mammogram for malignant neoplasm of breast: Secondary | ICD-10-CM

## 2020-04-20 DIAGNOSIS — Z780 Asymptomatic menopausal state: Secondary | ICD-10-CM | POA: Diagnosis not present

## 2020-04-20 LAB — COMPREHENSIVE METABOLIC PANEL
ALT: 12 U/L (ref 0–35)
AST: 13 U/L (ref 0–37)
Albumin: 4 g/dL (ref 3.5–5.2)
Alkaline Phosphatase: 69 U/L (ref 39–117)
BUN: 22 mg/dL (ref 6–23)
CO2: 29 mEq/L (ref 19–32)
Calcium: 9.3 mg/dL (ref 8.4–10.5)
Chloride: 105 mEq/L (ref 96–112)
Creatinine, Ser: 0.85 mg/dL (ref 0.40–1.20)
GFR: 82.84 mL/min (ref >60.00)
Glucose, Bld: 106 mg/dL — ABNORMAL HIGH (ref 70–99)
Potassium: 4.4 mEq/L (ref 3.5–5.1)
Sodium: 141 mEq/L (ref 135–145)
Total Bilirubin: 0.6 mg/dL (ref 0.2–1.2)
Total Protein: 6.8 g/dL (ref 6.0–8.3)

## 2020-04-20 LAB — LIPID PANEL
Cholesterol: 206 mg/dL — ABNORMAL HIGH (ref 0–200)
HDL: 67.6 mg/dL (ref >39.00)
LDL Cholesterol: 129 mg/dL — ABNORMAL HIGH (ref 0–99)
NonHDL: 138.67
Total CHOL/HDL Ratio: 3
Triglycerides: 49 mg/dL (ref 0.0–149.0)
VLDL: 9.8 mg/dL (ref 0.0–40.0)

## 2020-04-20 LAB — CBC
HCT: 43.4 % (ref 36.0–46.0)
Hemoglobin: 14.4 g/dL (ref 12.0–15.0)
MCHC: 33.1 g/dL (ref 30.0–36.0)
MCV: 99.4 fl (ref 78.0–100.0)
Platelets: 196 10*3/uL (ref 150.0–400.0)
RBC: 4.37 Mil/uL (ref 3.87–5.11)
RDW: 13 % (ref 11.5–15.5)
WBC: 4.2 10*3/uL (ref 4.0–10.5)

## 2020-04-20 LAB — VITAMIN D 25 HYDROXY (VIT D DEFICIENCY, FRACTURES): VITD: 14.76 ng/mL — ABNORMAL LOW (ref 30.00–100.00)

## 2020-04-20 LAB — HEMOGLOBIN A1C: Hgb A1c MFr Bld: 6.8 % — ABNORMAL HIGH (ref 4.6–6.5)

## 2020-04-20 NOTE — Progress Notes (Signed)
Subjective:    Patient ID: Sue Holloway, female    DOB: 01-01-1961, 59 y.o.   MRN: [id number]  HPI  Pt presents to the clinic today for her annual exam.  Flu: 05/2018 Tetanus: 2016 Pneumovax: never Covid: 11/2019, 11/2019 Pap Smear: 2018 Mammogram: 2018 Bone Density: > 5 years ago Colon Screening: 2018 Vision Screening:  as needed Dentist: as needed  Diet: She does eat meat. She consumes fruits and veggies daily. She tries to avoid fried foods. She drinks mostly water, or flavored water, rare diet soda. Exercise: None  Review of Systems      Past Medical History:  Diagnosis Date  . GERD (gastroesophageal reflux disease)    0ccasional heartburn  . Gout   . Low iron     Current Outpatient Medications  Medication Sig Dispense Refill  . Ginger, Zingiber officinalis, (GINGER PO) Take by mouth.    . Turmeric (QC TUMERIC COMPLEX PO) Take by mouth.     No current facility-administered medications for this visit.    No Known Allergies  Family History  Problem Relation Age of Onset  . Clotting disorder Sister     Social History   Socioeconomic History  . Marital status: Single    Spouse name: Not on file  . Number of children: Not on file  . Years of education: Not on file  . Highest education level: Not on file  Occupational History  . Not on file  Tobacco Use  . Smoking status: Current Every Day Smoker    Packs/day: 0.50    Types: Cigarettes  . Smokeless tobacco: Never Used  Substance and Sexual Activity  . Alcohol use: Yes    Comment: occasional  . Drug use: No  . Sexual activity: Not on file  Other Topics Concern  . Not on file  Social History Narrative  . Not on file   Social Determinants of Health   Financial Resource Strain:   . Difficulty of Paying Living Expenses: Not on file  Food Insecurity:   . Worried About Programme researcher, broadcasting/film/video in the Last Year: Not on file  . Ran Out of Food in the Last Year: Not on file  Transportation  Needs:   . Lack of Transportation (Medical): Not on file  . Lack of Transportation (Non-Medical): Not on file  Physical Activity:   . Days of Exercise per Week: Not on file  . Minutes of Exercise per Session: Not on file  Stress:   . Feeling of Stress : Not on file  Social Connections:   . Frequency of Communication with Friends and Family: Not on file  . Frequency of Social Gatherings with Friends and Family: Not on file  . Attends Religious Services: Not on file  . Active Member of Clubs or Organizations: Not on file  . Attends Banker Meetings: Not on file  . Marital Status: Not on file  Intimate Partner Violence:   . Fear of Current or Ex-Partner: Not on file  . Emotionally Abused: Not on file  . Physically Abused: Not on file  . Sexually Abused: Not on file     Constitutional: Denies fever, malaise, fatigue, headache or abrupt weight changes.  HEENT: Denies eye pain, eye redness, ear pain, ringing in the ears, wax buildup, runny nose, nasal congestion, bloody nose, or sore throat. Respiratory: Denies difficulty breathing, shortness of breath, cough or sputum production.   Cardiovascular: Pt reports BLE pain due to varicose veins. Denies chest pain,  chest tightness, palpitations or swelling in the hands or feet.  Gastrointestinal: Denies abdominal pain, bloating, constipation, diarrhea or blood in the stool.  GU: Denies urgency, frequency, pain with urination, burning sensation, blood in urine, odor or discharge. Musculoskeletal: Denies decrease in range of motion, difficulty with gait, muscle pain or joint pain and swelling.  Skin: Denies redness, rashes, lesions or ulcercations.  Neurological: Denies dizziness, difficulty with memory, difficulty with speech or problems with balance and coordination.  Psych: Denies anxiety, depression, SI/HI.  No other specific complaints in a complete review of systems (except as listed in HPI above).  Objective:   Physical  Exam BP 120/78   Pulse 86   Temp 97.9 F (36.6 C) (Temporal)   Wt 191 lb (86.6 kg)   SpO2 98%   BMI 28.62 kg/m   Wt Readings from Last 3 Encounters:  02/19/20 190 lb (86.2 kg)  07/25/11 244 lb (110.7 kg)  07/22/11 244 lb (110.7 kg)    General: Appears her stated age, well developed, well nourished in NAD. Skin: Warm, dry and intact. No ulcerations noted. HEENT: Head: normal shape and size; Eyes: sclera white, no icterus, conjunctiva pink, PERRLA and EOMs intact;  Neck:  Neck supple, trachea midline. No masses, lumps or thyromegaly present.  Cardiovascular: Normal rate and rhythm. S1,S2 noted.  No murmur, rubs or gallops noted. No JVD or BLE edema. Varicose veins noted of BLE. No carotid bruits noted. Pulmonary/Chest: Normal effort and positive vesicular breath sounds. No respiratory distress. No wheezes, rales or ronchi noted.  Abdomen: Soft and nontender. Normal bowel sounds. No distention or masses noted. Liver, spleen and kidneys non palpable. Musculoskeletal: Strength 5/5 BUE/BLE. No difficulty with gait.  Neurological: Alert and oriented. Cranial nerves II-XII grossly intact. Coordination normal.  Psychiatric: Mood and affect normal. Behavior is normal. Judgment and thought content normal.    BMET    Component Value Date/Time   NA 136 01/09/2013 2117   K 4.1 01/09/2013 2117   CL 101 01/09/2013 2117   CO2 26 01/09/2013 2117   GLUCOSE 93 01/09/2013 2117   BUN 11 01/09/2013 2117   CREATININE 0.61 01/09/2013 2117   CALCIUM 9.5 01/09/2013 2117   GFRNONAA >90 01/09/2013 2117   GFRAA >90 01/09/2013 2117    Lipid Panel  No results found for: CHOL, TRIG, HDL, CHOLHDL, VLDL, LDLCALC  CBC    Component Value Date/Time   WBC 8.5 01/09/2013 2117   RBC 4.44 01/09/2013 2117   HGB 14.6 01/09/2013 2117   HCT 42.4 01/09/2013 2117   PLT 185 01/09/2013 2117   MCV 95.5 01/09/2013 2117   MCH 32.9 01/09/2013 2117   MCHC 34.4 01/09/2013 2117   RDW 12.7 01/09/2013 2117    LYMPHSABS 2.1 01/09/2013 2117   MONOABS 1.0 01/09/2013 2117   EOSABS 0.1 01/09/2013 2117   BASOSABS 0.0 01/09/2013 2117    Hgb A1C No results found for: HGBA1C         Assessment & Plan:  Preventative Health Maintenance:  Flu shot today Tetanus UTD Pneumovax today Covid UTD Pap smear UTD Mammogram ordered- she will call to schedule Bone density ordered- she will call to schedule Colon screening UTD Encouraged her to consume a balanced diet and exercise regimen Advised her to see an eye doctor and dentist annually Will check CBC, CMET, Lipid, A1C and Vit D today  Will follow up after labs, RTC in 1 year for annual exam  Nicki Reaper, NP This visit occurred during the SARS-CoV-2 public  health emergency.  Safety protocols were in place, including screening questions prior to the visit, additional usage of staff PPE, and extensive cleaning of exam room while observing appropriate contact time as indicated for disinfecting solutions.

## 2020-04-20 NOTE — Patient Instructions (Signed)

## 2020-04-20 NOTE — Addendum Note (Signed)
Addended by: Roena Malady on: 04/20/2020 10:20 AM   Modules accepted: Orders

## 2020-06-01 ENCOUNTER — Encounter: Payer: Self-pay | Admitting: Internal Medicine

## 2020-07-07 ENCOUNTER — Other Ambulatory Visit: Payer: Self-pay

## 2020-07-07 ENCOUNTER — Ambulatory Visit: Admission: RE | Admit: 2020-07-07 | Payer: 59 | Source: Ambulatory Visit

## 2020-07-07 ENCOUNTER — Telehealth: Payer: Self-pay | Admitting: *Deleted

## 2020-07-07 NOTE — Telephone Encounter (Signed)
Will discuss at upcoming appt.

## 2020-07-07 NOTE — Telephone Encounter (Signed)
Patient called stating that she went for her mammogram today but it was not done. Patient stated that she was joking around with the technician and told her that she may have felt a lump one time at the top of her left breast. Patient stated that she is not sure if she actually felt something because she does not feel it now. Patient stated  the technician told her that she can not do the mammogram with her making that statement. Patient stated that she had no clue that this would happen. Patient stated that the technician told her that she would need to get a different order from her PCP and would reach out to her. After speaking with Melanie patient was scheduled to see Nicki Reaper NP tomorrow 12/1/211or a breast exam.

## 2020-07-08 ENCOUNTER — Ambulatory Visit (INDEPENDENT_AMBULATORY_CARE_PROVIDER_SITE_OTHER): Payer: 59 | Admitting: Internal Medicine

## 2020-07-08 ENCOUNTER — Ambulatory Visit
Admission: RE | Admit: 2020-07-08 | Discharge: 2020-07-08 | Disposition: A | Discharge status: Home / Self Care | Payer: 59 | Source: Ambulatory Visit | Attending: Internal Medicine | Admitting: Internal Medicine

## 2020-07-08 VITALS — BP 120/80 | HR 69 | Temp 97.9°F | Wt 196.0 lb

## 2020-07-08 DIAGNOSIS — N6459 Other signs and symptoms in breast: Secondary | ICD-10-CM | POA: Diagnosis not present

## 2020-07-08 DIAGNOSIS — Z780 Asymptomatic menopausal state: Secondary | ICD-10-CM

## 2020-07-08 LAB — HM DIABETES EYE EXAM

## 2020-07-08 NOTE — Progress Notes (Signed)
Subjective:    Patient ID: Sue Holloway, female    DOB: 08-02-1961, 59 y.o.   MRN: [id number]  HPI  Pt presents to the clinic today requesting a breast exam. She reports she went to get her mammogram, and when they asked her if she had felt any lumps in her breast, she said yes. She reports a few weeks ago, she felt a lump in her upper chest, but it was not in her breast tissue. It was there for a few days, and now she has not noticed it. She denies changes in the skin of the breast, lumps/masses or discharge from the nipple.   Review of Systems  Past Medical History:  Diagnosis Date  . GERD (gastroesophageal reflux disease)    0ccasional heartburn  . Gout   . Low iron     Current Outpatient Medications  Medication Sig Dispense Refill  . Ginger, Zingiber officinalis, (GINGER PO) Take by mouth.    . Turmeric (QC TUMERIC COMPLEX PO) Take by mouth.     No current facility-administered medications for this visit.    No Known Allergies  Family History  Problem Relation Age of Onset  . Clotting disorder Sister     Social History   Socioeconomic History  . Marital status: Single    Spouse name: Not on file  . Number of children: Not on file  . Years of education: Not on file  . Highest education level: Not on file  Occupational History  . Not on file  Tobacco Use  . Smoking status: Current Every Day Smoker    Packs/day: 0.50    Types: Cigarettes  . Smokeless tobacco: Never Used  Substance and Sexual Activity  . Alcohol use: Yes    Comment: occasional  . Drug use: No  . Sexual activity: Not on file  Other Topics Concern  . Not on file  Social History Narrative  . Not on file   Social Determinants of Health   Financial Resource Strain:   . Difficulty of Paying Living Expenses: Not on file  Food Insecurity:   . Worried About Programme researcher, broadcasting/film/video in the Last Year: Not on file  . Ran Out of Food in the Last Year: Not on file  Transportation Needs:   .  Lack of Transportation (Medical): Not on file  . Lack of Transportation (Non-Medical): Not on file  Physical Activity:   . Days of Exercise per Week: Not on file  . Minutes of Exercise per Session: Not on file  Stress:   . Feeling of Stress : Not on file  Social Connections:   . Frequency of Communication with Friends and Family: Not on file  . Frequency of Social Gatherings with Friends and Family: Not on file  . Attends Religious Services: Not on file  . Active Member of Clubs or Organizations: Not on file  . Attends Banker Meetings: Not on file  . Marital Status: Not on file  Intimate Partner Violence:   . Fear of Current or Ex-Partner: Not on file  . Emotionally Abused: Not on file  . Physically Abused: Not on file  . Sexually Abused: Not on file     Constitutional: Denies fever, malaise, fatigue, headache or abrupt weight changes.  Respiratory: Denies difficulty breathing, shortness of breath, cough or sputum production.   Cardiovascular: Denies chest pain, chest tightness, palpitations or swelling in the hands or feet.  Skin: Denies redness, rashes, lesions or ulcercations.  No other specific complaints in a complete review of systems (except as listed in HPI above).     Objective:   Physical Exam  BP 120/80   Pulse 69   Temp 97.9 F (36.6 C) (Temporal)   Wt 196 lb (88.9 kg)   SpO2 98%   BMI 29.37 kg/m   Wt Readings from Last 3 Encounters:  04/20/20 191 lb (86.6 kg)  02/19/20 190 lb (86.2 kg)  07/25/11 244 lb (110.7 kg)    General: Appears her stated age, well developed, well nourished in NAD. Breast:  Symmetrical. Fibrocystic changes noted bilaterally. No mass noted. No nipple discharge expressed. No axillary lymphadenopathy.  Cardiovascular: Normal rate and rhythm.  Pulmonary/Chest: Normal effort and positive vesicular breath sounds. No respiratory distress. No wheezes, rales or ronchi noted.  Neurological: Alert and oriented.   BMET     Component Value Date/Time   NA 141 04/20/2020 1002   K 4.4 04/20/2020 1002   CL 105 04/20/2020 1002   CO2 29 04/20/2020 1002   GLUCOSE 106 (H) 04/20/2020 1002   BUN 22 04/20/2020 1002   CREATININE 0.85 04/20/2020 1002   CALCIUM 9.3 04/20/2020 1002   GFRNONAA >90 01/09/2013 2117   GFRAA >90 01/09/2013 2117    Lipid Panel     Component Value Date/Time   CHOL 206 (H) 04/20/2020 1002   TRIG 49.0 04/20/2020 1002   HDL 67.60 04/20/2020 1002   CHOLHDL 3 04/20/2020 1002   VLDL 9.8 04/20/2020 1002   LDLCALC 129 (H) 04/20/2020 1002    CBC    Component Value Date/Time   WBC 4.2 04/20/2020 1002   RBC 4.37 04/20/2020 1002   HGB 14.4 04/20/2020 1002   HCT 43.4 04/20/2020 1002   PLT 196.0 04/20/2020 1002   MCV 99.4 04/20/2020 1002   MCH 32.9 01/09/2013 2117   MCHC 33.1 04/20/2020 1002   RDW 13.0 04/20/2020 1002   LYMPHSABS 2.1 01/09/2013 2117   MONOABS 1.0 01/09/2013 2117   EOSABS 0.1 01/09/2013 2117   BASOSABS 0.0 01/09/2013 2117    Hgb A1C Lab Results  Component Value Date   HGBA1C 6.8 (H) 04/20/2020          Assessment & Plan:  Breast Complaint:  Breast exam benign Will proceed with screening mammogram  Return precautions discussed  Nicki Reaper, NP This visit occurred during the SARS-CoV-2 public health emergency.  Safety protocols were in place, including screening questions prior to the visit, additional usage of staff PPE, and extensive cleaning of exam room while observing appropriate contact time as indicated for disinfecting solutions.

## 2020-07-09 ENCOUNTER — Encounter: Payer: Self-pay | Admitting: Internal Medicine

## 2020-07-09 NOTE — Patient Instructions (Signed)

## 2020-07-13 ENCOUNTER — Encounter: Payer: Self-pay | Admitting: Internal Medicine

## 2020-07-14 ENCOUNTER — Other Ambulatory Visit: Payer: Self-pay | Admitting: Internal Medicine

## 2020-07-14 DIAGNOSIS — Z1231 Encounter for screening mammogram for malignant neoplasm of breast: Secondary | ICD-10-CM

## 2020-07-26 ENCOUNTER — Other Ambulatory Visit: Payer: Self-pay | Admitting: Internal Medicine

## 2020-07-26 MED ORDER — IBANDRONATE SODIUM 150 MG PO TABS: 150.0000 mg | ORAL_TABLET | ORAL | 3 refills | Status: AC

## 2020-07-26 NOTE — Telephone Encounter (Signed)
Boniva, treatment for osteoporosis was sent to pharmacy

## 2020-07-27 MED ORDER — METFORMIN HCL 500 MG PO TABS: 500.0000 mg | ORAL_TABLET | Freq: Every day | ORAL | 0 refills | Status: DC

## 2020-08-19 ENCOUNTER — Ambulatory Visit: Payer: 59

## 2020-08-19 ENCOUNTER — Other Ambulatory Visit: Payer: 59

## 2020-08-26 ENCOUNTER — Other Ambulatory Visit: Payer: Self-pay

## 2020-08-26 ENCOUNTER — Ambulatory Visit
Admission: RE | Admit: 2020-08-26 | Discharge: 2020-08-26 | Disposition: A | Discharge status: Home / Self Care | Payer: 59 | Source: Ambulatory Visit | Attending: Internal Medicine | Admitting: Internal Medicine

## 2020-08-26 ENCOUNTER — Encounter: Payer: Self-pay | Admitting: Internal Medicine

## 2020-08-26 DIAGNOSIS — Z1231 Encounter for screening mammogram for malignant neoplasm of breast: Secondary | ICD-10-CM

## 2020-08-27 ENCOUNTER — Other Ambulatory Visit: Payer: Self-pay | Admitting: Internal Medicine

## 2020-08-27 DIAGNOSIS — R928 Other abnormal and inconclusive findings on diagnostic imaging of breast: Secondary | ICD-10-CM

## 2020-08-31 ENCOUNTER — Encounter: Payer: Self-pay | Admitting: Internal Medicine

## 2020-09-08 ENCOUNTER — Ambulatory Visit
Admission: RE | Admit: 2020-09-08 | Discharge: 2020-09-08 | Disposition: A | Discharge status: Home / Self Care | Payer: 59 | Source: Ambulatory Visit | Attending: Internal Medicine | Admitting: Internal Medicine

## 2020-09-08 ENCOUNTER — Other Ambulatory Visit: Payer: Self-pay

## 2020-09-08 ENCOUNTER — Ambulatory Visit: Payer: 59

## 2020-09-08 DIAGNOSIS — R928 Other abnormal and inconclusive findings on diagnostic imaging of breast: Secondary | ICD-10-CM

## 2020-10-21 ENCOUNTER — Other Ambulatory Visit: Payer: Self-pay | Admitting: Internal Medicine

## 2020-10-23 ENCOUNTER — Other Ambulatory Visit: Payer: Self-pay

## 2020-10-23 ENCOUNTER — Ambulatory Visit (HOSPITAL_COMMUNITY)
Admission: RE | Admit: 2020-10-23 | Discharge: 2020-10-23 | Disposition: A | Discharge status: Home / Self Care | Payer: 59 | Source: Ambulatory Visit | Attending: Emergency Medicine | Admitting: Emergency Medicine

## 2020-10-23 ENCOUNTER — Encounter (HOSPITAL_COMMUNITY): Payer: Self-pay

## 2020-10-23 VITALS — BP 116/81 | HR 98 | Temp 98.3°F | Resp 19

## 2020-10-23 DIAGNOSIS — R102 Pelvic and perineal pain: Secondary | ICD-10-CM | POA: Diagnosis present

## 2020-10-23 LAB — POCT URINALYSIS DIPSTICK, ED / UC
Glucose, UA: NEGATIVE mg/dL
Hgb urine dipstick: NEGATIVE
Ketones, ur: 15 mg/dL — AB
Nitrite: NEGATIVE
Protein, ur: 100 mg/dL — AB
Specific Gravity, Urine: 1.025 (ref 1.005–1.030)
Urobilinogen, UA: 1 mg/dL (ref 0.0–1.0)
pH: 6 (ref 5.0–8.0)

## 2020-10-23 NOTE — ED Provider Notes (Signed)
[facility]    CSN: [id number] Arrival date & time: 10/23/20  1249      History   Chief Complaint Chief Complaint  Patient presents with  . Vaginal Pain    HPI Sue Holloway is a 60 y.o. female.   Sue Holloway presents with complaints of vulva pain which started a few days ago. She feels it to the left side of vulva. Pain is worse with urinating. No pain with urination. History of bladder sling with mesh, as well as hysterectomy. No fevers. No vaginal bleeding. She is sexually active  With 1 partner. No pain with intercourse. Denies any previous similar. She did recently soak in a "dove salt" bath and wonders if that contributed.     ROS per HPI, negative if not otherwise mentioned.      Past Medical History:  Diagnosis Date  . GERD (gastroesophageal reflux disease)    0ccasional heartburn  . Gout   . Low iron     Patient Active Problem List   Diagnosis Date Noted  . GERD (gastroesophageal reflux disease) 02/19/2020  . Gout 02/19/2020  . Iron deficiency anemia 02/19/2020  . DM (diabetes mellitus), type 2 (HCC) 02/19/2020    Past Surgical History:  Procedure Laterality Date  . ABDOMINAL HYSTERECTOMY     still have ovaries  . ANTERIOR AND POSTERIOR REPAIR  07/25/2011   Procedure: ANTERIOR (CYSTOCELE) AND POSTERIOR REPAIR (RECTOCELE);  Surgeon: Fortino Sic, MD;  Location: [facility];  Service: Gynecology;  Laterality: N/A;  Anterior Repair  . BURCH PROCEDURE  07/25/2011   Procedure: BURCH PROCEDURE;  Surgeon: Fortino Sic, MD;  Location: [facility];  Service: Gynecology;  Laterality: N/A;    OB History   No obstetric history on file.      Home Medications    Prior to Admission medications   Medication Sig Start Date End Date Taking? Authorizing Provider  Ginger, Zingiber officinalis, (GINGER PO) Take by mouth.    [provider]  ibandronate (BONIVA) 150 MG tablet Take 1 tablet (150 mg total) by mouth every 30  (thirty) days. Take in the morning with a full glass of water, on an empty stomach, and do not take anything else by mouth or lie down for the next 30 min. 07/26/20   Lorre Munroe, NP  metFORMIN (GLUCOPHAGE) 500 MG tablet TAKE 1 TABLET(500 MG) BY MOUTH DAILY WITH BREAKFAST 10/21/20   Baity, Salvadore Oxford, NP  Turmeric (QC TUMERIC COMPLEX PO) Take by mouth.    [provider]    Family History Family History  Problem Relation Age of Onset  . Clotting disorder Sister     Social History Social History   Tobacco Use  . Smoking status: Current Every Day Smoker    Packs/day: 0.50    Types: Cigarettes  . Smokeless tobacco: Never Used  Substance Use Topics  . Alcohol use: Yes    Comment: occasional  . Drug use: No     Allergies   Patient has no known allergies.   Review of Systems Review of Systems   Physical Exam Triage Vital Signs ED Triage Vitals  Enc Vitals Group     BP 10/23/20 1316 116/81     Pulse Rate 10/23/20 1316 98     Resp 10/23/20 1316 19     Temp 10/23/20 1316 98.3 F (36.8 C)     Temp src --      SpO2 10/23/20 1316 99 %  Weight --      Height --      Head Circumference --      Peak Flow --      Pain Score 10/23/20 1315 7     Pain Loc --      Pain Edu? --      Excl. in GC? --    No data found.  Updated Vital Signs BP 116/81   Pulse 98   Temp 98.3 F (36.8 C)   Resp 19   SpO2 99%   Visual Acuity Right Eye Distance:   Left Eye Distance:   Bilateral Distance:    Right Eye Near:   Left Eye Near:    Bilateral Near:     Physical Exam Constitutional:      General: She is not in acute distress.    Appearance: She is well-developed.  Cardiovascular:     Rate and Rhythm: Normal rate.  Pulmonary:     Effort: Pulmonary effort is normal.  Abdominal:     Tenderness: There is no abdominal tenderness.  Genitourinary:    Labia:        Left: Lesion present.        Comments: Ulcer to left superior internal labia which is tender;  vagina with thin white creamy discharge without otherwise abnormal findings  Skin:    General: Skin is warm and dry.  Neurological:     Mental Status: She is alert and oriented to person, place, and time.      UC Treatments / Results  Labs (all labs ordered are listed, but only abnormal results are displayed) Labs Reviewed  POCT URINALYSIS DIPSTICK, ED / UC - Abnormal; Notable for the following components:      Result Value   Bilirubin Urine SMALL (*)    Ketones, ur 15 (*)    Protein, ur 100 (*)    Leukocytes,Ua TRACE (*)    All other components within normal limits  HSV CULTURE AND TYPING  CERVICOVAGINAL ANCILLARY ONLY    EKG   Radiology No results found.  Procedures Procedures (including critical care time)  Medications Ordered in UC Medications - No data to display  Initial Impression / Assessment and Plan / UC Course  I have reviewed the triage vital signs and the nursing notes.  Pertinent labs & imaging results that were available during my care of the patient were reviewed by me and considered in my medical decision making (see chart for details).     There is a vulvar ulcer on exam which is tender and appears to be source of discomfort. hsv swab collected. No cluster of lesions present. No abscess. Fissure or other skin breakdown, possibly from bath soak vs local trauma vs hsv considered. Supportive cares to allow for healing, with gyne follow up recommended. Patient verbalized understanding and agreeable to plan.    Final Clinical Impressions(s) / UC Diagnoses   Final diagnoses:  Vulvar pain     Discharge Instructions     Your urine does not indicate UTI.  I have tested the lesion on your vulva to evaluate for herpes, but this may be simply a fissure which is uncomfortable.  I would expect it to heal in the next week.  Vaginal swab is testing for bacterial vaginosis and yeast infection, as well as doing an std screen.  Please follow up with gynecology  if symptoms persist. Return for any worsening of symptoms.    ED Prescriptions    None  PDMP not reviewed this encounter.   Georgetta Haber, NP 10/23/20 1420

## 2020-10-23 NOTE — Discharge Instructions (Signed)
Your urine does not indicate UTI.  I have tested the lesion on your vulva to evaluate for herpes, but this may be simply a fissure which is uncomfortable.  I would expect it to heal in the next week.  Vaginal swab is testing for bacterial vaginosis and yeast infection, as well as doing an std screen.  Please follow up with gynecology if symptoms persist. Return for any worsening of symptoms.

## 2020-10-23 NOTE — ED Triage Notes (Signed)
Pt in with c/o left side vaginal pian that has been going on for  Days now  Pt states she had surgery years ago to help her with her stress incontinence and now whenever she urinates her flow is coming out on the left

## 2020-10-26 LAB — CERVICOVAGINAL ANCILLARY ONLY
Bacterial Vaginitis (gardnerella): POSITIVE — AB
Candida Glabrata: NEGATIVE
Candida Vaginitis: NEGATIVE
Chlamydia: NEGATIVE
Comment: NEGATIVE
Comment: NEGATIVE
Comment: NEGATIVE
Comment: NEGATIVE
Comment: NEGATIVE
Comment: NORMAL
Neisseria Gonorrhea: NEGATIVE
Trichomonas: NEGATIVE

## 2020-10-26 LAB — HSV CULTURE AND TYPING

## 2020-10-27 ENCOUNTER — Telehealth (HOSPITAL_COMMUNITY): Payer: Self-pay | Admitting: Emergency Medicine

## 2020-10-27 MED ORDER — METRONIDAZOLE 500 MG PO TABS: 500.0000 mg | ORAL_TABLET | Freq: Two times a day (BID) | ORAL | 0 refills | Status: AC

## 2021-01-19 ENCOUNTER — Other Ambulatory Visit: Payer: Self-pay | Admitting: Internal Medicine

## 2021-01-19 NOTE — Telephone Encounter (Signed)
  Notes to clinic Not a practice we approve rx for.  

## 2021-01-20 NOTE — Telephone Encounter (Signed)
Refill request Metformin Last office visit 07/08/20 Last refill 10/21/20 #90 No upcoming appointment scheduled

## 2021-10-15 ENCOUNTER — Other Ambulatory Visit: Payer: Self-pay

## 2022-05-13 IMAGING — MG MM DIGITAL DIAGNOSTIC UNILAT*L* W/ TOMO W/ CAD
6 series · 6 of 18 positions shown · non-contrast
Comparison: Previous exam(s).

CLINICAL DATA: 59-year-old female recalled from screening mammogram
dated 08/26/2020 for a possible left breast asymmetry.

EXAM:
DIGITAL DIAGNOSTIC UNILATERAL LEFT MAMMOGRAM WITH TOMO AND CAD
TECHNIQUE: Left digital diagnostic mammography and breast tomosynthesis was
performed. Digital images of the breasts were evaluated with
computer-aided detection.

[L MLO synth-2D (1 of 2)]
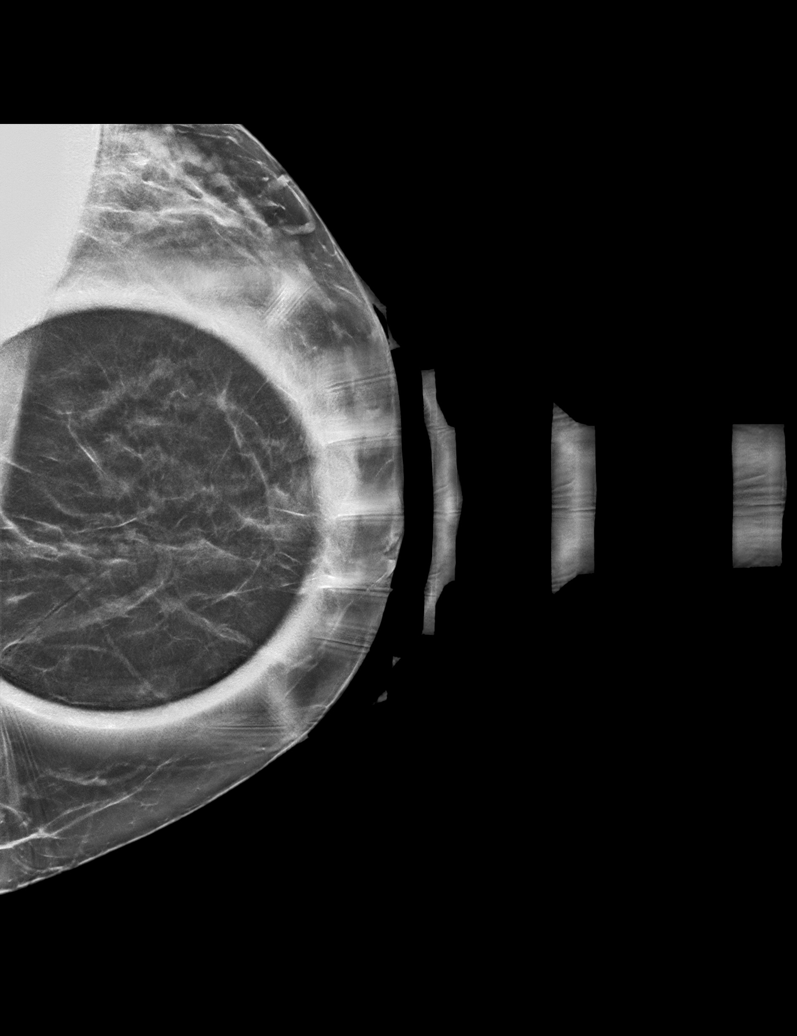

[L ML synth-2D]
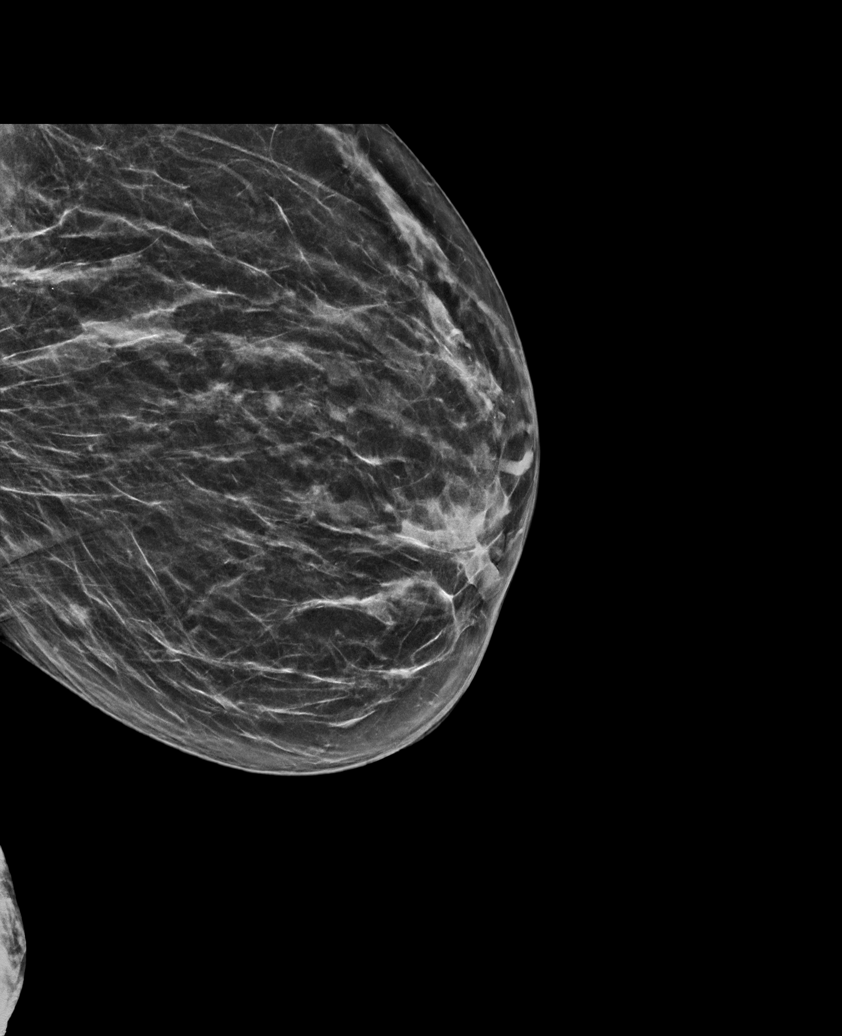

[L MLO synth-2D (2 of 2)]
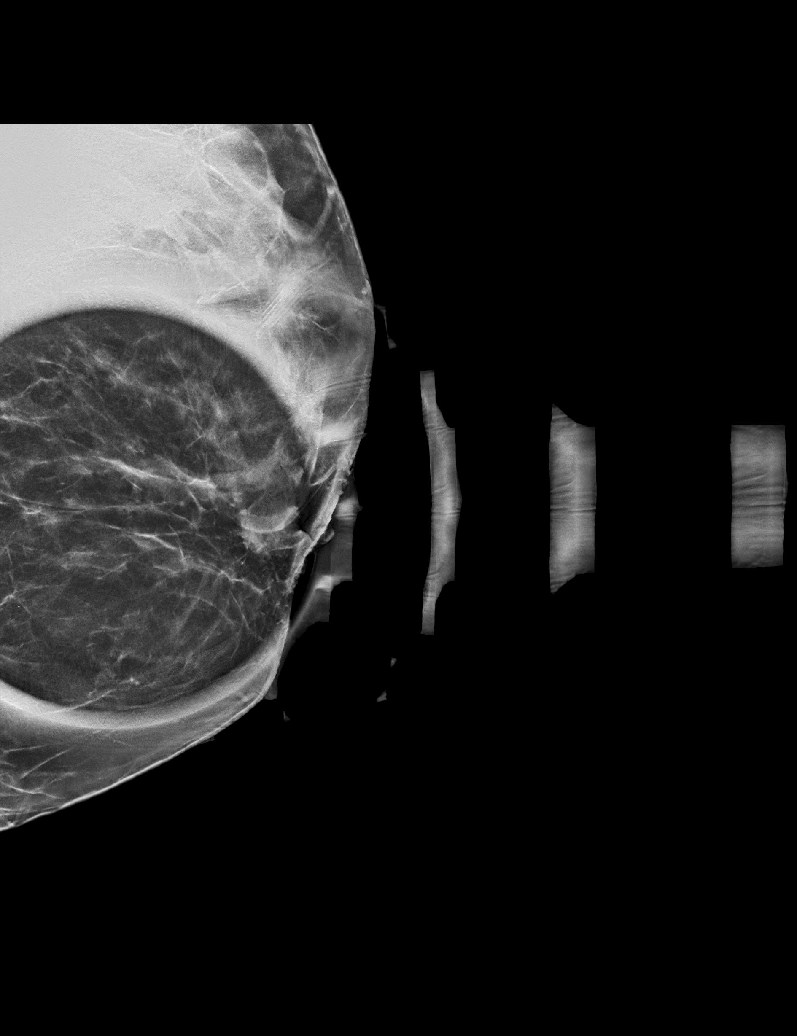

[L ML tomo · tomo slice 31/62.0]
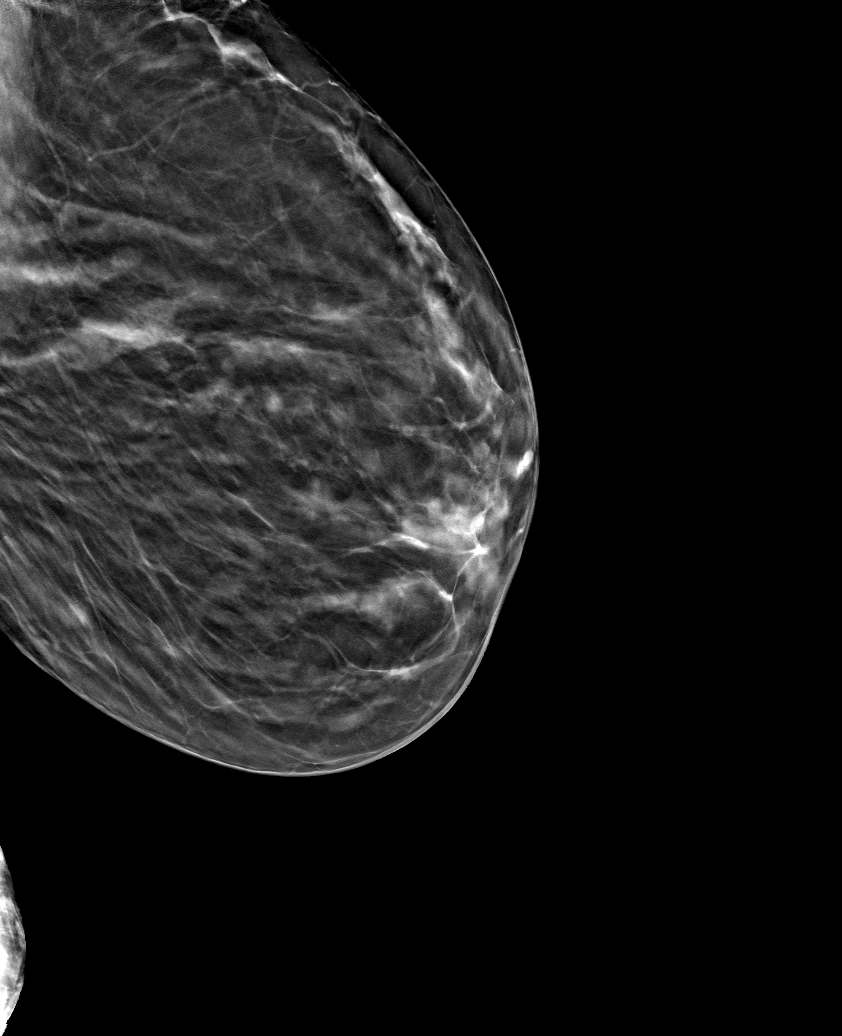

[L MLO tomo (1 of 2) · tomo slice 27/53.0]
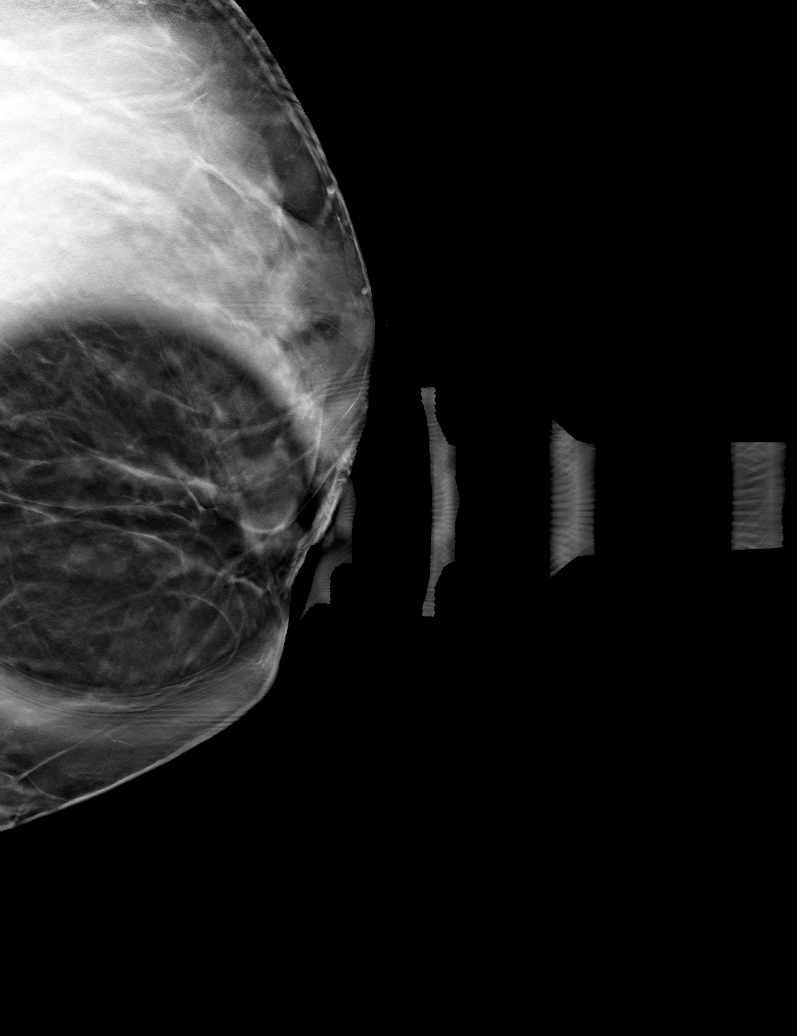

[L MLO tomo (2 of 2) · tomo slice 27/53.0]
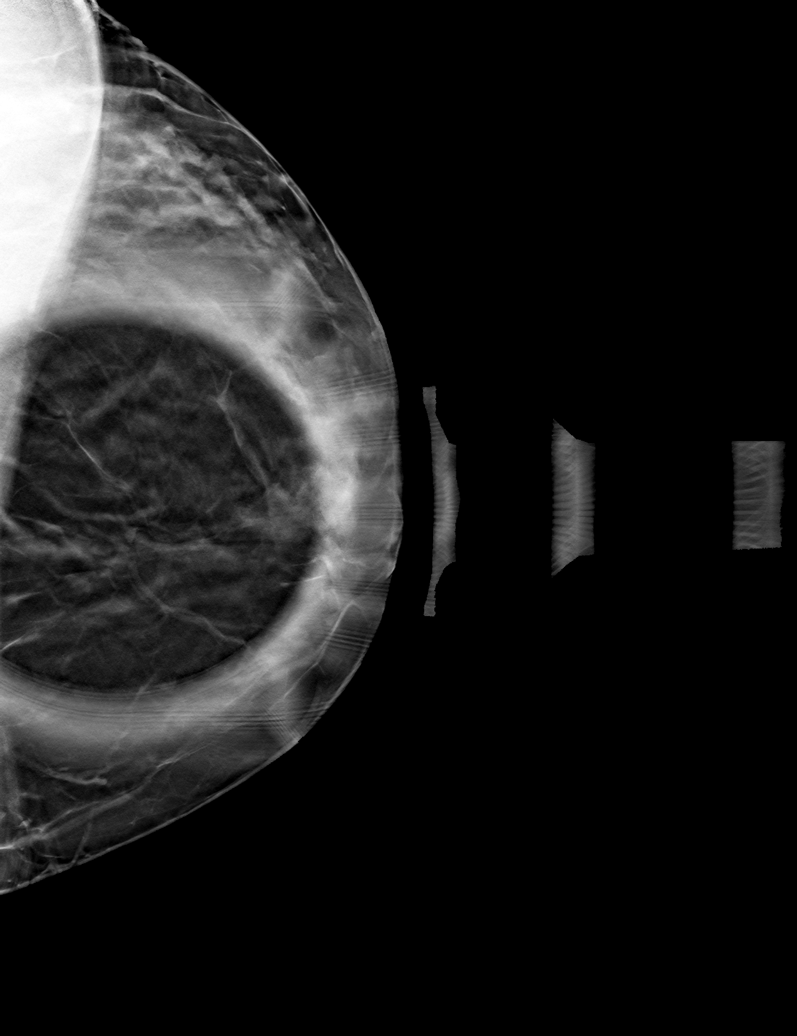

[6 of 18 positions shown; findings below may reference images not displayed]

ACR Breast Density Category b: There are scattered areas of
fibroglandular density.
FINDINGS: Previously described, possible asymmetry in the central left breast
is seen on the MLO projection only resolves into well dispersed
fibroglandular tissue on today's additional views. No suspicious
findings identified.
IMPRESSION: No mammographic evidence of malignancy.

RECOMMENDATION:
Screening mammogram in one year.(Code:E5-I-1R2)

I have discussed the findings and recommendations with the patient.
If applicable, a reminder letter will be sent to the patient
regarding the next appointment.

BI-RADS CATEGORY  1: Negative.

## 2022-06-01 ENCOUNTER — Other Ambulatory Visit: Payer: Self-pay

## 2022-06-01 ENCOUNTER — Emergency Department (HOSPITAL_COMMUNITY): Payer: Self-pay

## 2022-06-01 ENCOUNTER — Emergency Department (HOSPITAL_COMMUNITY)
Admission: EM | Admit: 2022-06-01 | Discharge: 2022-06-01 | Disposition: A | Discharge status: Home / Self Care | Payer: Self-pay | Attending: Emergency Medicine | Admitting: Emergency Medicine

## 2022-06-01 ENCOUNTER — Encounter (HOSPITAL_COMMUNITY): Payer: Self-pay

## 2022-06-01 DIAGNOSIS — Z1152 Encounter for screening for COVID-19: Secondary | ICD-10-CM | POA: Insufficient documentation

## 2022-06-01 DIAGNOSIS — E119 Type 2 diabetes mellitus without complications: Secondary | ICD-10-CM | POA: Insufficient documentation

## 2022-06-01 DIAGNOSIS — Z720 Tobacco use: Secondary | ICD-10-CM | POA: Insufficient documentation

## 2022-06-01 DIAGNOSIS — Z7984 Long term (current) use of oral hypoglycemic drugs: Secondary | ICD-10-CM | POA: Insufficient documentation

## 2022-06-01 DIAGNOSIS — J069 Acute upper respiratory infection, unspecified: Secondary | ICD-10-CM | POA: Insufficient documentation

## 2022-06-01 LAB — RESP PANEL BY RT-PCR (FLU A&B, COVID) ARPGX2
Influenza A by PCR: NEGATIVE
Influenza B by PCR: NEGATIVE
SARS Coronavirus 2 by RT PCR: NEGATIVE

## 2022-06-01 MED ORDER — BENZONATATE 100 MG PO CAPS: 100.0000 mg | ORAL_CAPSULE | Freq: Three times a day (TID) | ORAL | 0 refills | Status: AC

## 2022-06-01 MED ORDER — FAMOTIDINE 40 MG PO TABS: 20.0000 mg | ORAL_TABLET | Freq: Every day | ORAL | 0 refills | Status: AC

## 2022-06-01 MED ORDER — CETIRIZINE-PSEUDOEPHEDRINE ER 5-120 MG PO TB12: 1.0000 | ORAL_TABLET | Freq: Every day | ORAL | 0 refills | Status: AC

## 2022-06-01 NOTE — ED Provider Notes (Signed)
[facility] [place] [facility]ENC[facility]PT Provider Note   CSN: [id number] Arrival date & time: 06/01/22  5784     History  Chief Complaint  Patient presents with   Cough    Sue Holloway is a 61 y.o. female.   Cough   61 year old female presents emergency department with complaints of cough and sneezing.  Patient states that symptoms been present for the past 3 weeks.  She reports beginning of symptoms with nasal congestion and mild soreness of throat.  She states that she usually takes Zyrtec-D around this time of year but has been able unable to afford the medication.  She is currently taking no medication for this.  Reports current tobacco use denies history of COPD/emphysema.  Denies fever, chills, night sweats, sore throat, shortness of breath, abdominal pain, nausea, vomiting, neck stiffness/pain  Past medical history significant for GERD, gout, diabetes mellitus.  Home Medications Prior to Admission medications   Medication Sig Start Date End Date Taking? Authorizing Provider  benzonatate (TESSALON) 100 MG capsule Take 1 capsule (100 mg total) by mouth every 8 (eight) hours. 06/01/22  Yes Sherian Maroon A, PA  cetirizine-pseudoephedrine (ZYRTEC-D) 5-120 MG tablet Take 1 tablet by mouth daily. 06/01/22  Yes Sherian Maroon A, PA  famotidine (PEPCID) 40 MG tablet Take 0.5 tablets (20 mg total) by mouth daily. 06/01/22  Yes Sherian Maroon A, PA  metFORMIN (GLUCOPHAGE) 500 MG tablet TAKE 1 TABLET(500 MG) BY MOUTH DAILY WITH BREAKFAST 01/20/21   Worthy Rancher B, FNP  Ginger, Zingiber officinalis, (GINGER PO) Take by mouth.    [provider]  ibandronate (BONIVA) 150 MG tablet Take 1 tablet (150 mg total) by mouth every 30 (thirty) days. Take in the morning with a full glass of water, on an empty stomach, and do not take anything else by mouth or lie down for the next 30 min. 07/26/20   Lorre Munroe, NP  metroNIDAZOLE (FLAGYL) 500 MG tablet Take 1 tablet  (500 mg total) by mouth 2 (two) times daily. 10/27/20   Lamptey, Britta Mccreedy, MD  Turmeric (QC TUMERIC COMPLEX PO) Take by mouth.    [provider]      Allergies    Patient has no known allergies.    Review of Systems   Review of Systems  Respiratory:  Positive for cough.   All other systems reviewed and are negative.   Physical Exam Updated Vital Signs BP 102/87 (BP Location: Left Arm)   Pulse 80   Temp 98.2 F (36.8 C) (Oral)   Resp 20   Ht 5' 8.5" (1.74 m)   Wt 82.6 kg   SpO2 96%   BMI 27.27 kg/m  Physical Exam Vitals and nursing note reviewed.  Constitutional:      General: She is not in acute distress.    Appearance: She is well-developed.     Comments: Patient no acute respiratory distress.  HENT:     Head: Normocephalic and atraumatic.  Eyes:     Conjunctiva/sclera: Conjunctivae normal.  Cardiovascular:     Rate and Rhythm: Normal rate and regular rhythm.  Pulmonary:     Effort: Pulmonary effort is normal. No respiratory distress.     Breath sounds: Normal breath sounds. No wheezing or rales.  Abdominal:     Palpations: Abdomen is soft.     Tenderness: There is no abdominal tenderness.  Musculoskeletal:        General: No swelling.     Cervical back: Neck supple. No  rigidity or tenderness.  Skin:    General: Skin is warm and dry.     Capillary Refill: Capillary refill takes less than 2 seconds.  Neurological:     Mental Status: She is alert.  Psychiatric:        Mood and Affect: Mood normal.     ED Results / Procedures / Treatments   Labs (all labs ordered are listed, but only abnormal results are displayed) Labs Reviewed  RESP PANEL BY RT-PCR (FLU A&B, COVID) [id number]    EKG None  Radiology DG Chest 2 View  Result Date: 06/01/2022 CLINICAL DATA:  Cough for 3 weeks EXAM: CHEST - 2 VIEW COMPARISON:  Chest radiograph 05/18/2012 FINDINGS: The cardiomediastinal silhouette is normal. There is no focal consolidation or pulmonary edema. There  is no pleural effusion or pneumothorax There is no acute osseous abnormality. IMPRESSION: No radiographic evidence of acute cardiopulmonary process. Electronically Signed   By: Valetta Mole M.D.   On: 06/01/2022 13:29    Procedures Procedures    Medications Ordered in ED Medications - No data to display  ED Course/ Medical Decision Making/ A&P                           Medical Decision Making Risk OTC drugs. Prescription drug management.   This patient presents to the ED for concern of cough/sneeze, this involves an extensive number of treatment options, and is a complaint that carries with it a high risk of complications and morbidity.  The differential diagnosis includes meningitis, viral URI, pneumonia, malignancy, GERD,   Co morbidities that complicate the patient evaluation  See HPI   Additional history obtained:  Additional history obtained from EMR External records from outside source obtained and reviewed including hospital records   Lab Tests:  I Ordered, and personally interpreted labs.  The pertinent results include: Negative COVID/influenza.     Imaging Studies ordered:  I ordered imaging studies including chest x-ray I independently visualized and interpreted imaging which showed no acute cardiopulmonary abnormality I agree with the radiologist interpretation   Cardiac Monitoring: / EKG:  The patient was maintained on a cardiac monitor.  I personally viewed and interpreted the cardiac monitored which showed an underlying rhythm of: Sinus rhythm   Consultations Obtained:  N/a   Problem List / ED Course / Critical interventions / Medication management  Viral URI Reevaluation of the patient showed that the patient stayed the same I have reviewed the patients home medicines and have made adjustments as needed   Social Determinants of Health:  Chronic cigarette use   Test / Admission - Considered:  Viral URI with cough Vitals signs  within  normal range and stable throughout visit. Laboratory/imaging studies significant for: See above Patient symptoms could be secondary to viral URI.  Patient also has a history of GERD and has been off of antireflux medications.  She reports sometimes worsening cough at night or in the morning upon awakening.  We will treat cough as well as reflux with famotidine.  Patient also recommended to get antihistamine outpatient to take daily given significant improvement of symptoms in the past with said medication.  Recommend follow-up with PCP in 3 to 5 days.  Treatment plan discussed at length with patient she acknowledged and was agreeable to said plan. Worrisome signs and symptoms were discussed with the patient, and the patient acknowledged understanding to return to the ED if noticed. Patient was stable upon discharge.  Final Clinical Impression(s) / ED Diagnoses Final diagnoses:  Viral URI with cough    Rx / DC Orders ED Discharge Orders          Ordered    benzonatate (TESSALON) 100 MG capsule  Every 8 hours        06/01/22 1413    cetirizine-pseudoephedrine (ZYRTEC-D) 5-120 MG tablet  Daily        06/01/22 1414    famotidine (PEPCID) 40 MG tablet  Daily        06/01/22 1420              Peter Garter, [place] 06/01/22 1422    Ernie Avena, MD 06/01/22 1900

## 2022-06-01 NOTE — Discharge Instructions (Addendum)
Your work-up today was overall reassuring.  You tested negative for COVID and influenza.  Your chest x-ray was clear of any abnormalities.  Your symptoms are likely related to a viral URI.  We will treat your cough as he can get some rest at night.  Recommend starting an antihistamine such as Zyrtec at home.  Recommend follow-up with your PCP in 3 to 5 days.  Please do not hesitate to return to emergency department the worrisome signs and symptoms we discussed become apparent.

## 2022-06-01 NOTE — ED Triage Notes (Signed)
Pt to er, pt states that about three weeks ago she started having a cough and some sneezing, states that she started taking some medication, states that about three days later she started having a cough and started taking some cough syrup.  States that now she still feels congested.  Pt talking in full sentences, resps even and unlabored

## 2022-09-29 ENCOUNTER — Ambulatory Visit (HOSPITAL_COMMUNITY): Payer: Self-pay
